# Patient Record
Sex: Male | Born: 1945 | Race: White | Hispanic: No | Marital: Married | Smoking: Former smoker
Health system: Southern US, Community
[De-identification: ages and names within clinical notes are randomized; demographics above are authoritative.]

## PROBLEM LIST (undated history)

## (undated) DIAGNOSIS — I10 Essential (primary) hypertension: Secondary | ICD-10-CM

## (undated) DIAGNOSIS — D126 Benign neoplasm of colon, unspecified: Secondary | ICD-10-CM

## (undated) DIAGNOSIS — K5730 Diverticulosis of large intestine without perforation or abscess without bleeding: Secondary | ICD-10-CM

## (undated) DIAGNOSIS — E291 Testicular hypofunction: Secondary | ICD-10-CM

## (undated) DIAGNOSIS — I4891 Unspecified atrial fibrillation: Principal | ICD-10-CM

## (undated) DIAGNOSIS — G4733 Obstructive sleep apnea (adult) (pediatric): Secondary | ICD-10-CM

## (undated) DIAGNOSIS — K219 Gastro-esophageal reflux disease without esophagitis: Secondary | ICD-10-CM

## (undated) DIAGNOSIS — M109 Gout, unspecified: Secondary | ICD-10-CM

## (undated) DIAGNOSIS — F411 Generalized anxiety disorder: Secondary | ICD-10-CM

## (undated) DIAGNOSIS — I059 Rheumatic mitral valve disease, unspecified: Secondary | ICD-10-CM

## (undated) DIAGNOSIS — I340 Nonrheumatic mitral (valve) insufficiency: Secondary | ICD-10-CM

## (undated) DIAGNOSIS — Z8719 Personal history of other diseases of the digestive system: Secondary | ICD-10-CM

## (undated) DIAGNOSIS — R0989 Other specified symptoms and signs involving the circulatory and respiratory systems: Secondary | ICD-10-CM

## (undated) DIAGNOSIS — E7800 Pure hypercholesterolemia, unspecified: Secondary | ICD-10-CM

## (undated) DIAGNOSIS — G2581 Restless legs syndrome: Secondary | ICD-10-CM

## (undated) DIAGNOSIS — J309 Allergic rhinitis, unspecified: Secondary | ICD-10-CM

## (undated) DIAGNOSIS — J069 Acute upper respiratory infection, unspecified: Secondary | ICD-10-CM

## (undated) DIAGNOSIS — F329 Major depressive disorder, single episode, unspecified: Secondary | ICD-10-CM

## (undated) DIAGNOSIS — R7302 Impaired glucose tolerance (oral): Secondary | ICD-10-CM

## (undated) DIAGNOSIS — R0609 Other forms of dyspnea: Secondary | ICD-10-CM

## (undated) DIAGNOSIS — I38 Endocarditis, valve unspecified: Secondary | ICD-10-CM

## (undated) HISTORY — DX: Unspecified atrial fibrillation: I48.91

## (undated) HISTORY — DX: Generalized anxiety disorder: F41.1

## (undated) HISTORY — DX: Benign neoplasm of colon, unspecified: D12.6

## (undated) HISTORY — DX: Endocarditis, valve unspecified: I38

## (undated) HISTORY — DX: Impaired glucose tolerance (oral): R73.02

## (undated) HISTORY — PX: VASECTOMY: SHX75

## (undated) HISTORY — DX: Obstructive sleep apnea (adult) (pediatric): G47.33

## (undated) HISTORY — DX: Allergic rhinitis, unspecified: J30.9

## (undated) HISTORY — DX: Nonrheumatic mitral (valve) insufficiency: I34.0

## (undated) HISTORY — DX: Gastro-esophageal reflux disease without esophagitis: K21.9

## (undated) HISTORY — DX: Diverticulosis of large intestine without perforation or abscess without bleeding: K57.30

## (undated) HISTORY — DX: Essential (primary) hypertension: I10

## (undated) HISTORY — DX: Major depressive disorder, single episode, unspecified: F32.9

## (undated) HISTORY — DX: Gout, unspecified: M10.9

## (undated) HISTORY — DX: Other forms of dyspnea: R06.09

## (undated) HISTORY — PX: APPENDECTOMY: SHX54

## (undated) HISTORY — DX: Acute upper respiratory infection, unspecified: J06.9

## (undated) HISTORY — PX: SEPTOPLASTY: SHX2393

## (undated) HISTORY — DX: Pure hypercholesterolemia, unspecified: E78.00

## (undated) HISTORY — DX: Restless legs syndrome: G25.81

## (undated) HISTORY — PX: FRACTURE SURGERY: SHX138

## (undated) HISTORY — DX: Personal history of other diseases of the digestive system: Z87.19

## (undated) HISTORY — DX: Other specified symptoms and signs involving the circulatory and respiratory systems: R09.89

## (undated) HISTORY — DX: Rheumatic mitral valve disease, unspecified: I05.9

## (undated) HISTORY — DX: Testicular hypofunction: E29.1

---

## 1984-03-14 DIAGNOSIS — K2990 Gastroduodenitis, unspecified, without bleeding: Secondary | ICD-10-CM

## 1984-03-14 DIAGNOSIS — K2970 Gastritis, unspecified, without bleeding: Secondary | ICD-10-CM | POA: Insufficient documentation

## 1998-09-13 ENCOUNTER — Encounter: Payer: Self-pay | Admitting: Surgery

## 1998-09-13 ENCOUNTER — Emergency Department (HOSPITAL_COMMUNITY): Admission: EM | Admit: 1998-09-13 | Discharge: 1998-09-13 | Payer: Self-pay | Admitting: *Deleted

## 1999-03-16 ENCOUNTER — Ambulatory Visit: Admission: RE | Admit: 1999-03-16 | Discharge: 1999-03-16 | Payer: Self-pay | Admitting: Internal Medicine

## 1999-06-01 ENCOUNTER — Ambulatory Visit: Admission: RE | Admit: 1999-06-01 | Discharge: 1999-06-01 | Payer: Self-pay | Admitting: Family Medicine

## 2001-01-14 DIAGNOSIS — I38 Endocarditis, valve unspecified: Secondary | ICD-10-CM

## 2001-01-14 HISTORY — DX: Endocarditis, valve unspecified: I38

## 2001-01-20 ENCOUNTER — Emergency Department (HOSPITAL_COMMUNITY): Admission: EM | Admit: 2001-01-20 | Discharge: 2001-01-20 | Payer: Self-pay | Admitting: Emergency Medicine

## 2001-01-21 ENCOUNTER — Encounter: Payer: Self-pay | Admitting: Family Medicine

## 2001-01-21 ENCOUNTER — Encounter: Admission: RE | Admit: 2001-01-21 | Discharge: 2001-01-21 | Payer: Self-pay | Admitting: Family Medicine

## 2001-02-07 ENCOUNTER — Inpatient Hospital Stay (HOSPITAL_COMMUNITY): Admission: AD | Admit: 2001-02-07 | Discharge: 2001-02-13 | Payer: Self-pay | Admitting: Internal Medicine

## 2001-02-07 ENCOUNTER — Encounter (INDEPENDENT_AMBULATORY_CARE_PROVIDER_SITE_OTHER): Payer: Self-pay | Admitting: Specialist

## 2001-02-08 ENCOUNTER — Encounter: Payer: Self-pay | Admitting: Internal Medicine

## 2001-02-11 ENCOUNTER — Encounter: Payer: Self-pay | Admitting: Gastroenterology

## 2001-03-04 ENCOUNTER — Encounter: Admission: RE | Admit: 2001-03-04 | Discharge: 2001-03-04 | Payer: Self-pay | Admitting: Infectious Diseases

## 2001-03-14 ENCOUNTER — Ambulatory Visit (HOSPITAL_COMMUNITY): Admission: RE | Admit: 2001-03-14 | Discharge: 2001-03-14 | Payer: Self-pay | Admitting: Infectious Diseases

## 2001-03-14 ENCOUNTER — Encounter: Payer: Self-pay | Admitting: Infectious Diseases

## 2001-03-25 ENCOUNTER — Encounter: Admission: RE | Admit: 2001-03-25 | Discharge: 2001-03-25 | Payer: Self-pay | Admitting: Infectious Diseases

## 2001-04-16 ENCOUNTER — Encounter: Admission: RE | Admit: 2001-04-16 | Discharge: 2001-04-16 | Payer: Self-pay | Admitting: Infectious Diseases

## 2001-04-30 ENCOUNTER — Encounter: Admission: RE | Admit: 2001-04-30 | Discharge: 2001-04-30 | Payer: Self-pay | Admitting: Infectious Diseases

## 2003-10-05 ENCOUNTER — Encounter: Admission: RE | Admit: 2003-10-05 | Discharge: 2003-10-05 | Payer: Self-pay | Admitting: Family Medicine

## 2006-02-01 ENCOUNTER — Ambulatory Visit: Payer: Self-pay | Admitting: Gastroenterology

## 2006-02-07 ENCOUNTER — Ambulatory Visit: Payer: Self-pay | Admitting: Gastroenterology

## 2006-02-07 DIAGNOSIS — D126 Benign neoplasm of colon, unspecified: Secondary | ICD-10-CM

## 2006-02-07 HISTORY — DX: Benign neoplasm of colon, unspecified: D12.6

## 2006-02-08 ENCOUNTER — Inpatient Hospital Stay (HOSPITAL_COMMUNITY): Admission: EM | Admit: 2006-02-08 | Discharge: 2006-02-13 | Payer: Self-pay | Admitting: Emergency Medicine

## 2006-02-09 ENCOUNTER — Ambulatory Visit: Payer: Self-pay | Admitting: Internal Medicine

## 2006-02-11 DIAGNOSIS — K5730 Diverticulosis of large intestine without perforation or abscess without bleeding: Secondary | ICD-10-CM | POA: Insufficient documentation

## 2006-02-11 HISTORY — DX: Diverticulosis of large intestine without perforation or abscess without bleeding: K57.30

## 2006-02-23 ENCOUNTER — Ambulatory Visit: Payer: Self-pay | Admitting: Gastroenterology

## 2006-02-28 ENCOUNTER — Ambulatory Visit: Payer: Self-pay | Admitting: Gastroenterology

## 2006-03-22 ENCOUNTER — Ambulatory Visit: Payer: Self-pay | Admitting: Gastroenterology

## 2007-01-22 ENCOUNTER — Ambulatory Visit: Payer: Self-pay | Admitting: Gastroenterology

## 2007-02-05 ENCOUNTER — Encounter: Payer: Self-pay | Admitting: Gastroenterology

## 2007-02-05 ENCOUNTER — Ambulatory Visit: Payer: Self-pay | Admitting: Gastroenterology

## 2007-04-24 ENCOUNTER — Ambulatory Visit: Payer: Self-pay | Admitting: Pulmonary Disease

## 2007-04-24 LAB — CONVERTED CEMR LAB
Folate: 11.1 ng/mL
Iron: 144 ug/dL (ref 42–165)
Vitamin B-12: 423 pg/mL (ref 211–911)

## 2007-06-11 ENCOUNTER — Ambulatory Visit: Payer: Self-pay | Admitting: Pulmonary Disease

## 2007-06-17 ENCOUNTER — Encounter: Payer: Self-pay | Admitting: Pulmonary Disease

## 2007-06-17 DIAGNOSIS — I10 Essential (primary) hypertension: Secondary | ICD-10-CM

## 2007-06-17 DIAGNOSIS — I059 Rheumatic mitral valve disease, unspecified: Secondary | ICD-10-CM

## 2007-06-17 DIAGNOSIS — F329 Major depressive disorder, single episode, unspecified: Secondary | ICD-10-CM

## 2007-06-17 DIAGNOSIS — F3289 Other specified depressive episodes: Secondary | ICD-10-CM

## 2007-06-17 DIAGNOSIS — F411 Generalized anxiety disorder: Secondary | ICD-10-CM | POA: Insufficient documentation

## 2007-06-17 DIAGNOSIS — M109 Gout, unspecified: Secondary | ICD-10-CM

## 2007-06-17 DIAGNOSIS — E7800 Pure hypercholesterolemia, unspecified: Secondary | ICD-10-CM | POA: Insufficient documentation

## 2007-06-17 DIAGNOSIS — Z8719 Personal history of other diseases of the digestive system: Secondary | ICD-10-CM | POA: Insufficient documentation

## 2007-06-17 DIAGNOSIS — T7840XA Allergy, unspecified, initial encounter: Secondary | ICD-10-CM | POA: Insufficient documentation

## 2007-06-17 DIAGNOSIS — I38 Endocarditis, valve unspecified: Secondary | ICD-10-CM | POA: Insufficient documentation

## 2007-06-17 HISTORY — DX: Rheumatic mitral valve disease, unspecified: I05.9

## 2007-06-17 HISTORY — DX: Pure hypercholesterolemia, unspecified: E78.00

## 2007-06-17 HISTORY — DX: Gout, unspecified: M10.9

## 2007-06-17 HISTORY — DX: Other specified depressive episodes: F32.89

## 2007-06-17 HISTORY — DX: Major depressive disorder, single episode, unspecified: F32.9

## 2007-06-17 HISTORY — DX: Essential (primary) hypertension: I10

## 2007-06-17 HISTORY — DX: Generalized anxiety disorder: F41.1

## 2007-06-26 ENCOUNTER — Ambulatory Visit: Payer: Self-pay | Admitting: Gastroenterology

## 2007-07-01 ENCOUNTER — Encounter: Payer: Self-pay | Admitting: Gastroenterology

## 2007-07-01 ENCOUNTER — Ambulatory Visit (HOSPITAL_COMMUNITY): Admission: RE | Admit: 2007-07-01 | Discharge: 2007-07-01 | Payer: Self-pay | Admitting: Gastroenterology

## 2007-07-12 ENCOUNTER — Encounter: Admission: RE | Admit: 2007-07-12 | Discharge: 2007-07-12 | Payer: Self-pay | Admitting: Orthopedic Surgery

## 2007-07-16 DIAGNOSIS — K219 Gastro-esophageal reflux disease without esophagitis: Secondary | ICD-10-CM

## 2007-07-16 HISTORY — DX: Gastro-esophageal reflux disease without esophagitis: K21.9

## 2007-07-29 ENCOUNTER — Ambulatory Visit: Payer: Self-pay | Admitting: Pulmonary Disease

## 2007-07-29 DIAGNOSIS — G2581 Restless legs syndrome: Secondary | ICD-10-CM

## 2007-07-29 DIAGNOSIS — G4733 Obstructive sleep apnea (adult) (pediatric): Secondary | ICD-10-CM

## 2007-07-29 HISTORY — DX: Obstructive sleep apnea (adult) (pediatric): G47.33

## 2007-07-29 HISTORY — DX: Restless legs syndrome: G25.81

## 2007-12-10 ENCOUNTER — Encounter: Payer: Self-pay | Admitting: Pulmonary Disease

## 2008-01-08 ENCOUNTER — Encounter: Admission: RE | Admit: 2008-01-08 | Discharge: 2008-01-08 | Payer: Self-pay | Admitting: Family Medicine

## 2008-04-17 ENCOUNTER — Telehealth (INDEPENDENT_AMBULATORY_CARE_PROVIDER_SITE_OTHER): Payer: Self-pay | Admitting: *Deleted

## 2008-04-29 ENCOUNTER — Ambulatory Visit: Payer: Self-pay | Admitting: Pulmonary Disease

## 2008-06-16 HISTORY — PX: OTHER SURGICAL HISTORY: SHX169

## 2008-08-27 ENCOUNTER — Encounter: Payer: Self-pay | Admitting: Internal Medicine

## 2008-08-27 ENCOUNTER — Encounter: Admission: RE | Admit: 2008-08-27 | Discharge: 2008-08-27 | Payer: Self-pay | Admitting: Family Medicine

## 2008-10-08 ENCOUNTER — Ambulatory Visit: Payer: Self-pay | Admitting: Internal Medicine

## 2008-10-22 ENCOUNTER — Telehealth: Payer: Self-pay | Admitting: Internal Medicine

## 2008-12-01 ENCOUNTER — Telehealth: Payer: Self-pay | Admitting: Internal Medicine

## 2008-12-07 ENCOUNTER — Telehealth: Payer: Self-pay | Admitting: Internal Medicine

## 2008-12-08 ENCOUNTER — Encounter (INDEPENDENT_AMBULATORY_CARE_PROVIDER_SITE_OTHER): Payer: Self-pay | Admitting: *Deleted

## 2009-01-26 ENCOUNTER — Ambulatory Visit: Payer: Self-pay | Admitting: Gastroenterology

## 2009-02-05 ENCOUNTER — Telehealth: Payer: Self-pay | Admitting: Internal Medicine

## 2009-02-10 ENCOUNTER — Encounter: Payer: Self-pay | Admitting: Gastroenterology

## 2009-02-10 ENCOUNTER — Ambulatory Visit: Payer: Self-pay | Admitting: Gastroenterology

## 2009-02-11 ENCOUNTER — Encounter: Payer: Self-pay | Admitting: Gastroenterology

## 2009-03-01 ENCOUNTER — Telehealth: Payer: Self-pay | Admitting: Internal Medicine

## 2009-03-13 DIAGNOSIS — L255 Unspecified contact dermatitis due to plants, except food: Secondary | ICD-10-CM

## 2009-03-15 ENCOUNTER — Ambulatory Visit: Payer: Self-pay | Admitting: Family Medicine

## 2009-03-29 ENCOUNTER — Ambulatory Visit: Payer: Self-pay | Admitting: Internal Medicine

## 2009-03-29 LAB — CONVERTED CEMR LAB
AST: 38 units/L — ABNORMAL HIGH (ref 0–37)
Alkaline Phosphatase: 74 units/L (ref 39–117)
BUN: 19 mg/dL (ref 6–23)
Basophils Absolute: 0 10*3/uL (ref 0.0–0.1)
Calcium: 8.8 mg/dL (ref 8.4–10.5)
GFR calc non Af Amer: 80.47 mL/min (ref >60)
Glucose, Bld: 120 mg/dL — ABNORMAL HIGH (ref 70–99)
Glucose, Urine, Semiquant: NEGATIVE
HDL: 29.8 mg/dL — ABNORMAL LOW (ref >39.00)
Lymphocytes Relative: 24.1 % (ref 12.0–46.0)
Monocytes Relative: 10.4 % (ref 3.0–12.0)
Platelets: 130 10*3/uL — ABNORMAL LOW (ref 150.0–400.0)
Protein, U semiquant: NEGATIVE
RDW: 12.4 % (ref 11.5–14.6)
Sodium: 139 meq/L (ref 135–145)
Total Bilirubin: 1.6 mg/dL — ABNORMAL HIGH (ref 0.3–1.2)
Urobilinogen, UA: 0.2
VLDL: 33.4 mg/dL (ref 0.0–40.0)
WBC Urine, dipstick: NEGATIVE
pH: 5

## 2009-04-05 ENCOUNTER — Ambulatory Visit: Payer: Self-pay | Admitting: Internal Medicine

## 2009-05-05 ENCOUNTER — Ambulatory Visit: Payer: Self-pay | Admitting: Internal Medicine

## 2009-05-07 ENCOUNTER — Telehealth: Payer: Self-pay | Admitting: Internal Medicine

## 2009-05-17 ENCOUNTER — Telehealth: Payer: Self-pay | Admitting: Internal Medicine

## 2009-05-18 ENCOUNTER — Ambulatory Visit: Payer: Self-pay | Admitting: Internal Medicine

## 2009-05-18 DIAGNOSIS — E291 Testicular hypofunction: Secondary | ICD-10-CM

## 2009-05-18 HISTORY — DX: Testicular hypofunction: E29.1

## 2009-05-18 LAB — CONVERTED CEMR LAB
Sex Hormone Binding: 18 nmol/L (ref 13–71)
Testosterone-% Free: 2.6 % (ref 1.6–2.9)
Testosterone: 197.3 ng/dL — ABNORMAL LOW (ref 350–890)

## 2009-05-24 ENCOUNTER — Encounter: Payer: Self-pay | Admitting: Internal Medicine

## 2009-05-27 ENCOUNTER — Telehealth: Payer: Self-pay | Admitting: Internal Medicine

## 2009-06-03 ENCOUNTER — Telehealth: Payer: Self-pay | Admitting: Internal Medicine

## 2009-06-04 ENCOUNTER — Encounter: Payer: Self-pay | Admitting: Internal Medicine

## 2009-06-08 ENCOUNTER — Ambulatory Visit: Payer: Self-pay | Admitting: Internal Medicine

## 2009-06-08 DIAGNOSIS — J069 Acute upper respiratory infection, unspecified: Secondary | ICD-10-CM | POA: Insufficient documentation

## 2009-06-08 HISTORY — DX: Acute upper respiratory infection, unspecified: J06.9

## 2009-07-13 ENCOUNTER — Encounter: Payer: Self-pay | Admitting: Internal Medicine

## 2009-08-17 ENCOUNTER — Telehealth: Payer: Self-pay | Admitting: Internal Medicine

## 2009-11-23 ENCOUNTER — Telehealth: Payer: Self-pay | Admitting: Internal Medicine

## 2009-11-23 ENCOUNTER — Ambulatory Visit: Payer: Self-pay | Admitting: Internal Medicine

## 2009-11-26 ENCOUNTER — Telehealth: Payer: Self-pay | Admitting: Internal Medicine

## 2009-12-02 ENCOUNTER — Ambulatory Visit: Payer: Self-pay | Admitting: Internal Medicine

## 2009-12-02 DIAGNOSIS — J309 Allergic rhinitis, unspecified: Secondary | ICD-10-CM | POA: Insufficient documentation

## 2009-12-02 HISTORY — DX: Allergic rhinitis, unspecified: J30.9

## 2009-12-15 HISTORY — PX: US ECHOCARDIOGRAPHY: HXRAD669

## 2009-12-16 ENCOUNTER — Telehealth: Payer: Self-pay | Admitting: Internal Medicine

## 2009-12-20 ENCOUNTER — Telehealth (INDEPENDENT_AMBULATORY_CARE_PROVIDER_SITE_OTHER): Payer: Self-pay | Admitting: *Deleted

## 2009-12-20 ENCOUNTER — Ambulatory Visit: Payer: Self-pay | Admitting: Internal Medicine

## 2009-12-20 DIAGNOSIS — R0609 Other forms of dyspnea: Secondary | ICD-10-CM

## 2009-12-20 DIAGNOSIS — R042 Hemoptysis: Secondary | ICD-10-CM | POA: Insufficient documentation

## 2009-12-20 DIAGNOSIS — R05 Cough: Secondary | ICD-10-CM

## 2009-12-20 DIAGNOSIS — R0989 Other specified symptoms and signs involving the circulatory and respiratory systems: Secondary | ICD-10-CM

## 2009-12-20 DIAGNOSIS — R059 Cough, unspecified: Secondary | ICD-10-CM | POA: Insufficient documentation

## 2009-12-20 HISTORY — DX: Other forms of dyspnea: R06.09

## 2009-12-25 LAB — CONVERTED CEMR LAB: IgE (Immunoglobulin E), Serum: 15.9 intl units/mL (ref 0.0–180.0)

## 2009-12-27 ENCOUNTER — Telehealth: Payer: Self-pay | Admitting: Internal Medicine

## 2009-12-27 ENCOUNTER — Ambulatory Visit: Payer: Self-pay | Admitting: Internal Medicine

## 2009-12-27 LAB — CONVERTED CEMR LAB
Basophils Absolute: 0.1 10*3/uL (ref 0.0–0.1)
Eosinophils Absolute: 0.1 10*3/uL (ref 0.0–0.7)
HCT: 37.8 % — ABNORMAL LOW (ref 39.0–52.0)
Lymphocytes Relative: 17.6 % (ref 12.0–46.0)
Lymphs Abs: 1.2 10*3/uL (ref 0.7–4.0)
Monocytes Relative: 8.1 % (ref 3.0–12.0)
Platelets: 131 10*3/uL — ABNORMAL LOW (ref 150.0–400.0)
RDW: 14.1 % (ref 11.5–14.6)

## 2009-12-28 ENCOUNTER — Encounter: Payer: Self-pay | Admitting: Internal Medicine

## 2009-12-30 ENCOUNTER — Ambulatory Visit: Payer: Self-pay | Admitting: Internal Medicine

## 2009-12-30 DIAGNOSIS — J189 Pneumonia, unspecified organism: Secondary | ICD-10-CM

## 2009-12-30 DIAGNOSIS — I4891 Unspecified atrial fibrillation: Secondary | ICD-10-CM

## 2009-12-30 HISTORY — DX: Unspecified atrial fibrillation: I48.91

## 2010-01-04 ENCOUNTER — Ambulatory Visit: Payer: Self-pay | Admitting: Internal Medicine

## 2010-01-07 ENCOUNTER — Ambulatory Visit: Payer: Self-pay | Admitting: Internal Medicine

## 2010-01-07 ENCOUNTER — Encounter: Payer: Self-pay | Admitting: Internal Medicine

## 2010-01-07 ENCOUNTER — Telehealth (INDEPENDENT_AMBULATORY_CARE_PROVIDER_SITE_OTHER): Payer: Self-pay | Admitting: *Deleted

## 2010-01-07 ENCOUNTER — Ambulatory Visit: Payer: Self-pay | Admitting: Cardiology

## 2010-01-10 ENCOUNTER — Ambulatory Visit: Payer: Self-pay | Admitting: Internal Medicine

## 2010-01-10 ENCOUNTER — Telehealth: Payer: Self-pay | Admitting: Internal Medicine

## 2010-01-10 LAB — CONVERTED CEMR LAB
Eosinophils Relative: 1 % (ref 0–5)
HCT: 37.9 % — ABNORMAL LOW (ref 39.0–52.0)
Lymphocytes Relative: 15 % (ref 12–46)
Lymphs Abs: 1.3 10*3/uL (ref 0.7–4.0)
Neutro Abs: 6.3 10*3/uL (ref 1.7–7.7)
Platelets: 154 10*3/uL (ref 150–400)
WBC: 8.4 10*3/uL (ref 4.0–10.5)

## 2010-01-12 ENCOUNTER — Encounter: Payer: Self-pay | Admitting: Internal Medicine

## 2010-01-13 ENCOUNTER — Telehealth: Payer: Self-pay | Admitting: Internal Medicine

## 2010-01-14 ENCOUNTER — Telehealth (INDEPENDENT_AMBULATORY_CARE_PROVIDER_SITE_OTHER): Payer: Self-pay | Admitting: *Deleted

## 2010-01-14 HISTORY — PX: CARDIOVERSION: SHX1299

## 2010-01-19 ENCOUNTER — Telehealth: Payer: Self-pay | Admitting: Internal Medicine

## 2010-01-25 ENCOUNTER — Ambulatory Visit: Payer: Self-pay | Admitting: Internal Medicine

## 2010-02-07 ENCOUNTER — Ambulatory Visit: Payer: Self-pay | Admitting: Internal Medicine

## 2010-02-09 ENCOUNTER — Encounter: Payer: Self-pay | Admitting: Internal Medicine

## 2010-02-17 ENCOUNTER — Ambulatory Visit: Payer: Self-pay | Admitting: Cardiology

## 2010-02-17 ENCOUNTER — Ambulatory Visit (HOSPITAL_COMMUNITY): Admission: RE | Admit: 2010-02-17 | Discharge: 2010-02-17 | Payer: Self-pay | Admitting: Cardiology

## 2010-02-24 ENCOUNTER — Ambulatory Visit: Payer: Self-pay | Admitting: Cardiology

## 2010-03-22 ENCOUNTER — Ambulatory Visit: Payer: Self-pay | Admitting: Cardiology

## 2010-04-05 ENCOUNTER — Ambulatory Visit: Payer: Self-pay | Admitting: Cardiology

## 2010-04-06 ENCOUNTER — Telehealth: Payer: Self-pay | Admitting: Internal Medicine

## 2010-04-21 ENCOUNTER — Ambulatory Visit: Payer: Self-pay | Admitting: Internal Medicine

## 2010-04-21 LAB — CONVERTED CEMR LAB
ALT: 35 units/L (ref 0–53)
BUN: 20 mg/dL (ref 6–23)
Basophils Relative: 0.5 % (ref 0.0–3.0)
Bilirubin, Direct: 0.3 mg/dL (ref 0.0–0.3)
CO2: 30 meq/L (ref 19–32)
Chloride: 102 meq/L (ref 96–112)
Cholesterol: 135 mg/dL (ref 0–200)
Creatinine, Ser: 1 mg/dL (ref 0.4–1.5)
Eosinophils Absolute: 0.2 10*3/uL (ref 0.0–0.7)
Eosinophils Relative: 4.3 % (ref 0.0–5.0)
Glucose, Urine, Semiquant: NEGATIVE
HCT: 41.7 % (ref 39.0–52.0)
Ketones, urine, test strip: NEGATIVE
Lymphs Abs: 1.6 10*3/uL (ref 0.7–4.0)
MCHC: 34.7 g/dL (ref 30.0–36.0)
MCV: 97.3 fL (ref 78.0–100.0)
Monocytes Absolute: 0.5 10*3/uL (ref 0.1–1.0)
Neutrophils Relative %: 56.3 % (ref 43.0–77.0)
PSA: 0.71 ng/mL (ref 0.10–4.00)
Platelets: 131 10*3/uL — ABNORMAL LOW (ref 150.0–400.0)
Potassium: 4.3 meq/L (ref 3.5–5.1)
Specific Gravity, Urine: 1.025
TSH: 1.88 microintl units/mL (ref 0.35–5.50)
Total Bilirubin: 2.2 mg/dL — ABNORMAL HIGH (ref 0.3–1.2)
Total Protein: 6.6 g/dL (ref 6.0–8.3)
Triglycerides: 197 mg/dL — ABNORMAL HIGH (ref 0.0–149.0)
Urobilinogen, UA: 1
pH: 5

## 2010-04-28 ENCOUNTER — Ambulatory Visit: Payer: Self-pay | Admitting: Internal Medicine

## 2010-05-02 ENCOUNTER — Ambulatory Visit: Payer: Self-pay | Admitting: Cardiology

## 2010-05-04 ENCOUNTER — Telehealth: Payer: Self-pay

## 2010-06-03 ENCOUNTER — Ambulatory Visit: Payer: Self-pay | Admitting: Cardiology

## 2010-06-28 ENCOUNTER — Ambulatory Visit: Payer: Self-pay | Admitting: Cardiology

## 2010-07-05 ENCOUNTER — Ambulatory Visit: Payer: Self-pay | Admitting: Cardiology

## 2010-07-20 ENCOUNTER — Ambulatory Visit: Payer: Self-pay | Admitting: Cardiology

## 2010-07-26 ENCOUNTER — Telehealth: Payer: Self-pay | Admitting: Internal Medicine

## 2010-07-26 ENCOUNTER — Ambulatory Visit
Admission: RE | Admit: 2010-07-26 | Discharge: 2010-07-26 | Payer: Self-pay | Source: Home / Self Care | Attending: Internal Medicine | Admitting: Internal Medicine

## 2010-08-03 ENCOUNTER — Telehealth (INDEPENDENT_AMBULATORY_CARE_PROVIDER_SITE_OTHER): Payer: Self-pay | Admitting: *Deleted

## 2010-08-05 ENCOUNTER — Ambulatory Visit: Payer: Self-pay | Admitting: Cardiology

## 2010-08-15 ENCOUNTER — Emergency Department (HOSPITAL_COMMUNITY)
Admission: EM | Admit: 2010-08-15 | Discharge: 2010-08-15 | Payer: Self-pay | Source: Home / Self Care | Admitting: Emergency Medicine

## 2010-08-15 LAB — BASIC METABOLIC PANEL
BUN: 11 mg/dL (ref 6–23)
CO2: 27 mEq/L (ref 19–32)
Calcium: 8.2 mg/dL — ABNORMAL LOW (ref 8.4–10.5)
Chloride: 103 mEq/L (ref 96–112)
Creatinine, Ser: 0.94 mg/dL (ref 0.4–1.5)
GFR calc Af Amer: >60 mL/min (ref >60)

## 2010-08-15 LAB — PROTIME-INR: Prothrombin Time: 25.4 seconds — ABNORMAL HIGH (ref 11.6–15.2)

## 2010-08-18 ENCOUNTER — Ambulatory Visit (INDEPENDENT_AMBULATORY_CARE_PROVIDER_SITE_OTHER): Payer: No Typology Code available for payment source | Admitting: Cardiology

## 2010-08-18 DIAGNOSIS — I4891 Unspecified atrial fibrillation: Secondary | ICD-10-CM

## 2010-08-18 NOTE — Progress Notes (Signed)
Summary: MED SWITCH  Phone Note Call from Patient Call back at Home Phone [phone]   Caller: Patient Call For: Dennis Savers  MD Summary of Call: PT WOULD LIKE TO SWITCH TO LEXAPRO AND TO LIPTOR INSTEAD OF CRESTOR. [facility]  [phone] Initial call taken by: Heron Sabins,  July 26, 2010 11:02 AM  Follow-up for Phone Call        ok TO SWITCH TO GENERIC LIPITOR 20 MG AND GENERIC LEXAPRO 20 MG  Follow-up by: Dennis Savers  MD,  July 26, 2010 12:24 PM    New/Updated Medications: LIPITOR 20 MG TABS (ATORVASTATIN CALCIUM) once daily LEXAPRO 20 MG TABS (ESCITALOPRAM OXALATE) qd Prescriptions: LEXAPRO 20 MG TABS (ESCITALOPRAM OXALATE) qd  #30 x 6   Entered by:   Duard Brady LPN   Authorized by:   Dennis Savers  MD   Signed by:   Duard Brady LPN on 82/99/3716   Method used:   Electronically to        [facility]. [phone]* (retail)       [phone]       [place], [place]  [address]       Ph: [phone]       Fax: [phone]   RxID:   [id number] LIPITOR 20 MG TABS (ATORVASTATIN CALCIUM) once daily  #30 x 6   Entered by:   Duard Brady LPN   Authorized by:   Dennis Savers  MD   Signed by:   Duard Brady LPN on 71/24/5809   Method used:   Electronically to        [facility]. [phone]* (retail)       [phone]       [place], [place]  [address]       Ph: [phone]       Fax: [phone]   RxID:   [id number]

## 2010-08-18 NOTE — Progress Notes (Signed)
Summary: cpap machine  Phone Note Call from Patient   Caller: Patient Call For: Avnoor Koury Reason for Call: Talk to Nurse Summary of Call: pt on cpap, was put on new machine to check pressure.  Done with testing, DME has told pt it is time for a new machine.  Pt really liked new machine he was tested on.  Just need script for new machine, has humidifier and heater and adjust pressure automatically.  New machine is quiet. Initial call taken by: Eugene Gavia,  January 10, 2010 3:49 PM  Follow-up for Phone Call        I met him when he came in for PFT and discussed his abnormal CXR/ hemoptysis, as well as he preference for autoPAP. I gave him a script for autoPAP. Follow-up by: Waymon Budge MD,  January 10, 2010 4:03 PM    New/Updated Medications: * CPAP MACHINE AUTOPAP 5-15, HUMIDIFIER Intolerant of fixed cpap Prescriptions: CPAP MACHINE AUTOPAP 5-15, HUMIDIFIER Intolerant of fixed cpap  #1 x prn   Entered and Authorized by:   Waymon Budge MD   Signed by:   Waymon Budge MD on 01/10/2010   Method used:   Print then Give to Patient   RxID:   [id number]

## 2010-08-18 NOTE — Assessment & Plan Note (Signed)
Summary: allergy testing/kcw   Vital Signs:  Patient profile:   65 year old male Height:      75 inches Weight:      254 pounds BMI:     31.86 O2 Sat:      97 % on Room air Pulse rate:   100 / minute BP sitting:   122 / 84  (right arm) Cuff size:   regular  Vitals Entered By: Vivianne Spence  O2 Flow:  Room air CC: Allergy Testing   Copy to:  Dr Amador Cunas Primary Provider/Referring Provider:  [name]  MD  CC:  Allergy Testing.  History of Present Illness: December 20, 2009- 63 yom referred for persistent cough with new onset hemoptysis. He only admitted as we were finishing, that he had been coughng streaks of red blood in mucus over 3 days, with none in last 3 days. He has coughed for several months. He said this had been a bad year for allergies, initially implying he felt his was an allergy problem. Throat tickle cough with no wheeze, chest pain, nodes, fever. More aware of exertional dyspnea on stairs and hills and weight up 25 lbs in past year. Allergic rhinitis since age 78. Allergy vaccine remotely, directed at common inhalants. Perennial cough and postnasal drip, worst in Spring and Fall. No hx of asthma. Recently changed Benicar to Benazepril and Zyrtec to Allegra-D 24 hr for his insurance HSA.  Says Zyrtec and Allegra-D not good enough. CXR 08/26/09- shallow inspiration, NAD. Hx OSA/ CPAP- Dr Craige Cotta- used all night every night. Hx LAUP. Hx GERD- controlled. 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 hx treadmill 2010 Dr Deborah Chalk "OK".  January 04, 2010- Hemoptysis/ pneumonia, ? allergy, cough, AF/ warfarin, OSA Newly dx'd AF- warfarin by Dr Deborah Chalk. CBC w/o leukocytosis. No further hemoptysis despite coumadin. Feels weaker and easier dyspneic with exertion. He asks if this is related to his OSA. We also discussed his heart status. He has stopped the decongestant and is taking the plain allegra only. We agreed to wait on allergy testing.  Coughing occ trace brown. Dr Kirtland Bouchard gave Zpak- completed. CXR reviewed fronm 2 weeks ago- ? RLL pneumonia or possibly parenchymal blood. 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 IgE 15.9, no specific elevations on panmel.     Preventive Screening-Counseling & Management  Alcohol-Tobacco     Smoking Status: quit     Year Quit: 1978  Current Medications (verified): 1)  Xanax 1 Mg  Tabs (Alprazolam) .... Once Daily 2)  Zyrtec Allergy 10 Mg  Tabs (Cetirizine Hcl) .... As Needed 3)  Adult Aspirin Low Strength 81 Mg  Tbdp (Aspirin) .... Once Daily 4)  Allopurinol 300 Mg  Tabs (Allopurinol) .... Once Daily 5)  Omeprazole 40 Mg  Cpdr (Omeprazole) .Marland Kitchen.. 1 By Mouth Daily 6)  Niaspan 500 Mg Cr-Tabs (Niacin (Antihyperlipidemic)) .... 2  By Mouth Daily 7)  Hydrocodone-Homatropine 5-1.5 Mg/11ml Syrp (Hydrocodone-Homatropine) .Marland Kitchen.. 1 Teaspoon Every 6 Hours As Needed For Cough 8)  Fish Oil 1000 Mg Caps (Omega-3 Fatty Acids) .... Bid 9)  Vitamin C 500 Mg Tabs (Ascorbic Acid) .... Qd 10)  Citalopram Hydrobromide 40 Mg Tabs (Citalopram Hydrobromide) .... One Daily 11)  Fexofenadine Hcl 180 Mg Tabs (Fexofenadine Hcl) .... One Daily 12)  Simvastatin 40 Mg Tabs (Simvastatin) .... One Daily 13)  Benazepril Hcl 40 Mg Tabs (Benazepril Hcl) .... One Daily 14)  Flonase 50 Mcg/act  Susp (Fluticasone Propionate) .Marland Kitchen.. 1-2 Sprays Each Nostril Once Daily At Bedtime. 15)  Warfarin Sodium 5 Mg Tabs (Warfarin Sodium) .... One Daily 16)  Diltiazem Hcl Cr 120 Mg Xr12h-Cap (Diltiazem Hcl) .... Two Times A Day  Allergies (verified): No Known Drug Allergies  Past History:  Past Medical History: Last updated: 12/30/2009 GERD (ICD-530.81) RESTLESS LEG SYNDROME (ICD-333.94) DIVERTICULOSIS, COLON (ICD-562.10) GASTRITIS (ICD-535.50) TUBULOVILLOUS ADENOMA, COLON (ICD-211.3) GASTROINTESTINAL HEMORRHAGE, HX OF (ICD-V12.79)-duodenal ulcer with hemorrhage in 1983 ENDOCARDITIS  (ICD-424.90) 2002 MITRAL VALVE PROLAPSE (ICD-424.0) GOUT (ICD-274.9) ANXIETY (ICD-300.00) DEPRESSION (ICD-311) SLEEP APNEA (ICD-780.57) lumbar disk disease ([place]) ALLERGY (ICD-995.3) ALLERGIC Rhinitis HYPERCHOLESTEROLEMIA (ICD-272.0) HYPERTENSION (ICD-401.9) Atrial fibrillation-onset December 30, 2009  Past Surgical History: Last updated: 12-Jan-2010 Appendectomy Septoplasty 1982 Uvulopharyngoplasty Vasectomy 1976  colonoscopy 2007,2008, 2010  Stress test in December 2009  Family History: Last updated: January 12, 2010 Father- died CVA age 80, Mouth ca, COPD Mother-   h/o breast Ca, heart dis  one brother one sister  Social History: Last updated: 01-12-10 Married Smoked 1 PPD x 20 yrs, quit 1980's Regular exercise-no Business owner- renovates churches, administrative, much travel ETOH- 2-4 drinks almost daily  Risk Factors: Exercise: no (10/08/2008)  Risk Factors: Smoking Status: quit (01/04/2010)  Social History: Smoking Status:  quit  Physical Exam  Additional Exam:  General: A/Ox3; pleasant and cooperative, NAD, SKIN: no rash, lesions NODES: no lymphadenopathy HEENT: [place]/AT, EOM- WNL, Conjuctivae- injected, PERRLA, TM-WNL, Nose-mucus bridging, Throat- clear and wnl, Mallampati  III NECK: Supple w/ fair ROM, JVD- none, normal carotid impulses w/o bruits Thyroid- normal to palpation CHEST: fine rhonchi right lateral chest HEART: RRR, no m/g/r heard ABDOMEN: Soft and nl;  ZOX:WRUE, nl pulses, no edema  NEURO: Grossly intact to observation      Impression & Recommendations:  Problem # 1:  HEMOPTYSIS UNSPECIFIED (ICD-786.30) This was related to new infiltrate RLL and may have been a hemorrhagic bronchitis or pulmonary hemorrhage. Fortunately he hasn't re-bled on coumadin. The following medications were removed from the medication list:    Zithromax Z-pak 250 Mg Tabs (Azithromycin) .Marland Kitchen..Marland Kitchen Two initially, then one daily for 4 additional days His updated  medication list for this problem includes:    Vitamin C 500 Mg Tabs (Ascorbic acid) ..... Qd  Problem # 2:  COUGH (ICD-786.2)  We are deferring allergy work up to reconsider in the future if needed  Problem # 3:  PNEUMONIA, RIGHT LOWER LOBE (ICD-486)  He has comppleted Z pak. Sputum sounds a little purulent or perhaps old blood. We will repeat CXR. The following medications were removed from the medication list:    Zithromax Z-pak 250 Mg Tabs (Azithromycin) .Marland Kitchen..Marland Kitchen Two initially, then one daily for 4 additional days  Problem # 4:  OBSTRUCTIVE SLEEP APNEA (ICD-327.23) He has felt tireder, noting some drowsiness in church even before the Afib. I discussed since he was here today and we will autotitrate for pressure recheck.   Medications Added to Medication List This Visit: 1)  Diltiazem Hcl Cr 120 Mg Xr12h-cap (Diltiazem hcl) .... Two times a day 2)  Cpap [facility]   Other Orders: Est. Patient Level III (45409) DME Referral (DME) T-2 View CXR (71020TC)  Patient Instructions: 1)  Please schedule a follow-up appointment in 3 weeks. 2)  A chest x-ray has been recommended.  Your imaging study may require preauthorization.  3)  PCC wil contact [facility] about autotitrating your CPAP for presure check

## 2010-08-18 NOTE — Progress Notes (Signed)
Summary: new rx coumadin directions  Phone Note Refill Request Message from:  Fax from Pharmacy on January 13, 2010 9:01 AM  Refills Requested: Medication #1:  WARFARIN SODIUM 5 MG TABS one daily need change in sig for insurance to cover . 1 to 1 1/2 by mouth once daily [facility]   Method Requested: Fax to Local Pharmacy Initial call taken by: Duard Brady LPN,  January 13, 2010 9:02 AM  Follow-up for Phone Call        change made to sig - new rx [facility]. KIK Follow-up by: Duard Brady LPN,  January 13, 2010 9:04 AM    New/Updated Medications: WARFARIN SODIUM 5 MG TABS (WARFARIN SODIUM) 1 to 1-1/2 tabs by mouth once daily Prescriptions: WARFARIN SODIUM 5 MG TABS (WARFARIN SODIUM) 1 to 1-1/2 tabs by mouth once daily  #45 x 6   Entered by:   Duard Brady LPN   Authorized by:   [name]  MD   Signed by:   Duard Brady LPN on 24/40/1027   Method used:   Electronically to        [facility]. [phone]* (retail)       [phone]       [place], [place]  [address]       Ph: [phone]       Fax: [phone]   RxID:   [id number]

## 2010-08-18 NOTE — Progress Notes (Signed)
Summary: appeal next week  Phone Note Other Incoming   Caller: andrea with [phone] Summary of Call: Appeal meeting next week.  Do have testerone from 05-05-09 & from 05-18-09 & free testerone from 05-18-09.  If any other info you'd like to submit, our fax number is [phone] Georjean Mode.  If questions call me at [phone].   Initial call taken by: Rudy Jew, RN,  August 17, 2009 9:58 AM  Follow-up for Phone Call        noted Follow-up by: [name]  MD,  August 17, 2009 12:44 PM

## 2010-08-18 NOTE — Assessment & Plan Note (Signed)
Summary: discuss labs/dm   Vital Signs:  Patient profile:   65 year old male Weight:      255 pounds Temp:     98.2 degrees F oral BP sitting:   128 / 80  (left arm) Cuff size:   large  Vitals Entered By: Duard Brady LPN (December 30, 2009 11:11 AM) CC: F/U on labs Is Patient Diabetic? No   Primary Care Provider:  [name]  MD  CC:  F/U on labs.  History of Present Illness: 65 year old patient  who has a 4 week history of cough   which occasionally has been blood-tinged.  He had a chest x-ray performed a few weeks ago.  That was consistent the right lower lobe pneumonia.  He has had some earlier, fever and chills, and due to a history of endocarditis.  Blood cultures were obtained.  These have been normal to date.  At the present time.  He still has some cough, but no further hemoptysis and in general, is better.  He is scheduled for a 2-D echocardiogram next week.  He does have a history of mitral valve prolapse  Preventive Screening-Counseling & Management  Alcohol-Tobacco     Smoking Status: never  Allergies (verified): No Known Drug Allergies  Past History:  Past Medical History: GERD (ICD-530.81) RESTLESS LEG SYNDROME (ICD-333.94) DIVERTICULOSIS, COLON (ICD-562.10) GASTRITIS (ICD-535.50) TUBULOVILLOUS ADENOMA, COLON (ICD-211.3) GASTROINTESTINAL HEMORRHAGE, HX OF (ICD-V12.79)-duodenal ulcer with hemorrhage in 1983 ENDOCARDITIS (ICD-424.90) 2002 MITRAL VALVE PROLAPSE (ICD-424.0) GOUT (ICD-274.9) ANXIETY (ICD-300.00) DEPRESSION (ICD-311) SLEEP APNEA (ICD-780.57) lumbar disk disease ([place]) ALLERGY (ICD-995.3) ALLERGIC Rhinitis HYPERCHOLESTEROLEMIA (ICD-272.0) HYPERTENSION (ICD-401.9) Atrial fibrillation-onset December 30, 2009  Social History: Smoking Status:  never  Review of Systems       The patient complains of fever and prolonged cough.  The patient denies anorexia, weight loss, weight gain, vision loss, decreased hearing, hoarseness, chest  pain, syncope, dyspnea on exertion, peripheral edema, headaches, hemoptysis, abdominal pain, melena, hematochezia, severe indigestion/heartburn, hematuria, incontinence, genital sores, muscle weakness, suspicious skin lesions, transient blindness, difficulty walking, depression, unusual weight change, abnormal bleeding, enlarged lymph nodes, angioedema, breast masses, and testicular masses.    Physical Exam  General:  overweight-appearing.  normal blood pressure, afebrileoverweight-appearing.   Head:  Normocephalic and atraumatic without obvious abnormalities. No apparent alopecia or balding. Eyes:  No corneal or conjunctival inflammation noted. EOMI. Perrla. Funduscopic exam benign, without hemorrhages, exudates or papilledema. Vision grossly normal. Ears:  External ear exam shows no significant lesions or deformities.  Otoscopic examination reveals clear canals, tympanic membranes are intact bilaterally without bulging, retraction, inflammation or discharge. Hearing is grossly normal bilaterally. Mouth:  Oral mucosa and oropharynx without lesions or exudates.  Teeth in good repair. Neck:  No deformities, masses, or tenderness noted. Lungs:  rales present at the right base Heart:  rhythm is  irregular grade 3/6 systolic murmur, loudest at the apex Abdomen:  Bowel sounds positive,abdomen soft and non-tender without masses, organomegaly or hernias noted.   Impression & Recommendations:  Problem # 1:  PNEUMONIA, RIGHT LOWER LOBE (ICD-486)  His updated medication list for this problem includes:    Zithromax Z-pak 250 Mg Tabs (Azithromycin) .Marland Kitchen..Marland Kitchen Two initially, then one daily for 4 additional days  His updated medication list for this problem includes:    Zithromax Z-pak 250 Mg Tabs (Azithromycin) .Marland Kitchen..Marland Kitchen Two initially, then one daily for 4 additional days  Problem # 2:  DYSPNEA ON EXERTION (ICD-786.09)  His updated medication list for this problem includes:  Benazepril Hcl 40 Mg Tabs  (Benazepril hcl) ..... One daily  His updated medication list for this problem includes:    Benazepril Hcl 40 Mg Tabs (Benazepril hcl) ..... One daily  Problem # 3:  ATRIAL FIBRILLATION (ICD-427.31)  His updated medication list for this problem includes:    Adult Aspirin Low Strength 81 Mg Tbdp (Aspirin) ..... Once daily    Warfarin Sodium 5 Mg Tabs (Warfarin sodium) ..... One daily    Diltiazem Hcl Cr 120 Mg Xr12h-cap (Diltiazem hcl) ..... One daily  Complete Medication List: 1)  Xanax 1 Mg Tabs (Alprazolam) .... Once daily 2)  Zyrtec Allergy 10 Mg Tabs (Cetirizine hcl) .... As needed 3)  Adult Aspirin Low Strength 81 Mg Tbdp (Aspirin) .... Once daily 4)  Allopurinol 300 Mg Tabs (Allopurinol) .... Once daily 5)  Colchicine 0.6 Mg Tabs (Colchicine) .... One tablet four times daily until gout resolves. 6)  Omeprazole 40 Mg Cpdr (Omeprazole) .Marland Kitchen.. 1 by mouth daily 7)  Niaspan 500 Mg Cr-tabs (Niacin (antihyperlipidemic)) .... 2  by mouth daily 8)  Hydrocodone-homatropine 5-1.5 Mg/35ml Syrp (Hydrocodone-homatropine) .Marland Kitchen.. 1 teaspoon every 6 hours as needed for cough 9)  Fish Oil 1000 Mg Caps (Omega-3 fatty acids) .... Bid 10)  Vitamin C 500 Mg Tabs (Ascorbic acid) .... Qd 11)  Citalopram Hydrobromide 40 Mg Tabs (Citalopram hydrobromide) .... One daily 12)  Fexofenadine Hcl 180 Mg Tabs (Fexofenadine hcl) .... One daily 13)  Simvastatin 40 Mg Tabs (Simvastatin) .... One daily 14)  Benazepril Hcl 40 Mg Tabs (Benazepril hcl) .... One daily 15)  Tessalon 200 Mg Caps (Benzonatate) .Marland Kitchen.. 1 by mouth three times a day prn 16)  Flonase 50 Mcg/act Susp (Fluticasone propionate) .Marland Kitchen.. 1-2 sprays each nostril once daily at bedtime. 17)  Zithromax Z-pak 250 Mg Tabs (Azithromycin) .... Two initially, then one daily for 4 additional days 18)  Warfarin Sodium 5 Mg Tabs (Warfarin sodium) .... One daily 19)  Diltiazem Hcl Cr 120 Mg Xr12h-cap (Diltiazem hcl) .... One daily  Other Orders: EKG w/ Interpretation  (93000)  Patient Instructions: 1)  follow-up with cardiology in 4 days. 2)  Take your antibiotic as prescribed until ALL of it is gone, but stop if you develop a rash or swelling and contact our office as soon as possible. Prescriptions: DILTIAZEM HCL CR 120 MG XR12H-CAP (DILTIAZEM HCL) one daily  #30 x 0   Entered and Authorized by:   [name]  MD   Signed by:   [name]  MD on 12/30/2009   Method used:   Electronically to        [facility]. [phone]* (retail)       [phone]       [place], [place]  [address]       Ph: [phone]       Fax: [phone]   RxID:   [id number] WARFARIN SODIUM 5 MG TABS (WARFARIN SODIUM) one daily  #30 x 0   Entered and Authorized by:   [name]  MD   Signed by:   [name]  MD on 12/30/2009   Method used:   Electronically to        [facility]. [phone]* (retail)       [phone]       [place], [place]  [address]       Ph:  [phone]       Fax: [phone]   RxID:   [id number] Z-PAK 250 MG TABS (AZITHROMYCIN) two initially, then one daily for 4 additional days  #6 x 0   Entered and Authorized by:   [name]  MD   Signed by:   [name]  MD on 12/30/2009   Method used:   Electronically to        [facility]. [phone]* (retail)       [phone] [address]       [place], [place]  [address]       Ph: [phone]       Fax: [phone]   RxID:   [id number]

## 2010-08-18 NOTE — Assessment & Plan Note (Signed)
Summary: 3 week return/mhh   Copy to:  Dr Amador Cunas Primary Provider/Referring Provider:  [name]  MD  CC:  3 week follow up  and review PFTs.  History of Present Illness:  January 04, 2010- Hemoptysis/ pneumonia, ? allergy, cough, AF/ warfarin, OSA Newly dx'd AF- warfarin by Dr Deborah Chalk. CBC w/o leukocytosis. No further hemoptysis despite coumadin. Feels weaker and easier dyspneic with exertion. He asks if this is related to his OSA. We also discussed his heart status. He has stopped the decongestant and is taking the plain allegra only. We agreed to wait on allergy testing. Coughing occ trace brown. Dr Kirtland Bouchard gave Zpak- completed. CXR reviewed fronm 2 weeks ago- ? RLL pneumonia or possibly parenchymal blood. 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 IgE 15.9, no specific elevations on panmel.  January 25, 2010- Hemoptysis/ pneumonia, ? allergy, Cough, AF/ warfarin, OSA Feels well now, with no cough. He feels back to normal. He remains in AF and is going back to Dr Deborah Chalk tomorrow. There has been no more hemoptysis. His original question to me about possible allergy is on hold unless it bothers him. He likes CPAP auto at 5-15 and is compliant with this. We reviewed PFT showing mild obstruction only in small airways.  CT- minor residual effusion, but no active pneumonia. He is almost completely cleared.   Preventive Screening-Counseling & Management  Alcohol-Tobacco     Smoking Status: quit > 6 months     Packs/Day: 1.0     Year Quit: 1980     Pack years: 20  Current Medications (verified): 1)  Xanax 1 Mg  Tabs (Alprazolam) .... Once Daily 2)  Adult Aspirin Low Strength 81 Mg  Tbdp (Aspirin) .... Once Daily 3)  Allopurinol 300 Mg  Tabs (Allopurinol) .... Once Daily 4)  Omeprazole 40 Mg  Cpdr (Omeprazole) .Marland Kitchen.. 1 By Mouth Daily 5)  Niaspan 500 Mg Cr-Tabs (Niacin (Antihyperlipidemic)) .... 2  By Mouth Daily 6)  Fish Oil 1000 Mg  Caps (Omega-3 Fatty Acids) .... Bid 7)  Vitamin C 500 Mg Tabs (Ascorbic Acid) .... Qd 8)  Citalopram Hydrobromide 40 Mg Tabs (Citalopram Hydrobromide) .... One Daily 9)  Fexofenadine Hcl 180 Mg Tabs (Fexofenadine Hcl) .... One Daily 10)  Flonase 50 Mcg/act Susp (Fluticasone Propionate) .Marland Kitchen.. 1-2 Sprays Each Nostril Once Daily At Bedtime. 11)  Warfarin Sodium 5 Mg Tabs (Warfarin Sodium) .Marland Kitchen.. 1 To 1-1/2 Tabs By Mouth Once Daily 12)  Cpap Machine Autopap 5-15, Humidifier .... Intolerant of Fixed Cpap 13)  Cpap Autopap 5-15 .... May Set Either Auto or Fixed Pressure 11, If Machine Can't Auto. 14)  Cardizem Cd 240 Mg Xr24h-Cap (Diltiazem Hcl Coated Beads) .Marland Kitchen.. 1 Once Daily  Allergies (verified): No Known Drug Allergies  Past History:  Past Medical History: Last updated: 12/30/2009 GERD (ICD-530.81) RESTLESS LEG SYNDROME (ICD-333.94) DIVERTICULOSIS, COLON (ICD-562.10) GASTRITIS (ICD-535.50) TUBULOVILLOUS ADENOMA, COLON (ICD-211.3) GASTROINTESTINAL HEMORRHAGE, HX OF (ICD-V12.79)-duodenal ulcer with hemorrhage in 1983 ENDOCARDITIS (ICD-424.90) 2002 MITRAL VALVE PROLAPSE (ICD-424.0) GOUT (ICD-274.9) ANXIETY (ICD-300.00) DEPRESSION (ICD-311) SLEEP APNEA (ICD-780.57) lumbar disk disease ([place]) ALLERGY (ICD-995.3) ALLERGIC Rhinitis HYPERCHOLESTEROLEMIA (ICD-272.0) HYPERTENSION (ICD-401.9) Atrial fibrillation-onset December 30, 2009  Past Surgical History: Last updated: December 21, 2009 Appendectomy Septoplasty 1982 Uvulopharyngoplasty Vasectomy 1976  colonoscopy 2007,2008, 2010  Stress test in December 2009  Family History: Last updated: 12-21-2009 Father- died CVA age 50, Mouth ca, COPD Mother-   h/o breast Ca, heart dis  one brother one sister  Social History:  Last updated: 12/20/2009 Married Smoked 1 PPD x 20 yrs, quit 1980's Regular exercise-no Business owner- renovates churches, administrative, much travel ETOH- 2-4 drinks almost daily  Risk Factors: Exercise: no  (10/08/2008)  Risk Factors: Smoking Status: quit > 6 months (01/25/2010) Packs/Day: 1.0 (01/25/2010)  Social History: Smoking Status:  quit > 6 months Pack years:  20 Packs/Day:  1.0  Review of Systems      See HPI       The patient complains of indigestion.  The patient denies shortness of breath with activity, shortness of breath at rest, productive cough, non-productive cough, coughing up blood, chest pain, irregular heartbeats, acid heartburn, loss of appetite, weight change, abdominal pain, difficulty swallowing, sore throat, tooth/dental problems, headaches, nasal congestion/difficulty breathing through nose, and sneezing.    Vital Signs:  Patient profile:   65 year old male Height:      75 inches Weight:      253 pounds O2 Sat:      96 % on Room air Pulse rate:   120 / minute BP sitting:   132 / 84  (right arm)  Vitals Entered By: Renold Genta RCP, LPN (January 25, 2010 9:52 AM)  O2 Flow:  Room air CC: 3 week follow up , review PFTs Comments Medications reviewed with patient Renold Genta RCP, LPN  January 25, 2010 9:56 AM    Physical Exam  Additional Exam:  General: A/Ox3; pleasant and cooperative, NAD, SKIN: no rash, lesions NODES: no lymphadenopathy HEENT: /AT, EOM- WNL, Conjuctivae- injected, PERRLA, TM-WNL, Nose-mucus bridging, Throat- clear and wnl, Mallampati  III NECK: Supple w/ fair ROM, JVD- none, normal carotid impulses w/o bruits Thyroid- normal to palpation CHEST: Clear now to P&A HEART: irregular, no m/g/r heard ABDOMEN: Soft and nl;  ZOX:WRUE, nl pulses, no edema  NEURO: Grossly intact to observation      Impression & Recommendations:  Problem # 1:  PNEUMONIA, RIGHT LOWER LOBE (ICD-486)  Almost completely resolved now. This should go on to clear without further attention  Problem # 2:  HEMOPTYSIS UNSPECIFIED (ICD-786.30)  Heme stopped when pneumonia cleared. He does remain on Warfarin for AF. updated medication list for this problem  includes:    Vitamin C 500 Mg Tabs (Ascorbic acid) ..... Qd  Problem # 3:  ALLERGIC RHINITIS (ICD-477.9) Minor problem now. He can come back to this is if needed later. The following medications were removed from the medication list:    Zyrtec Allergy 10 Mg Tabs (Cetirizine hcl) .Marland Kitchen... As needed His updated medication list for this problem includes:    Fexofenadine Hcl 180 Mg Tabs (Fexofenadine hcl) ..... One daily    Flonase 50 Mcg/act Susp (Fluticasone propionate) .Marland Kitchen... 1-2 sprays each nostril once daily at bedtime.  Problem # 4:  OBSTRUCTIVE SLEEP APNEA (ICD-327.23) He wanted instruction for his DME company so they could reset his spare CPAP tpo the same auto setting. If it isn't compatible, we will have it set to the goal determined by his latest download. He is stable now and compliannt with autoPAP. I will see him in 6 months unless needed sooner.  Medications Added to Medication List This Visit: 1)  Cardizem Cd 240 Mg Xr24h-cap (Diltiazem hcl coated beads) .Marland Kitchen.. 1 once daily  Other Orders: Est. Patient Level IV (45409)  Patient Instructions: 1)  Please schedule a follow-up appointment in 6 months. 2)   continue CPAP 3)  CC Dr Deborah Chalk

## 2010-08-18 NOTE — Assessment & Plan Note (Signed)
Summary: PERSISTENT COUGH ON TESSALON/PS   Vital Signs:  Patient profile:   65 year old male Weight:      247 pounds Temp:     97.9 degrees F oral BP sitting:   112 / 70  (left arm) Cuff size:   large  Vitals Entered By: Duard Brady LPN (Dec 02, 2009 8:23 AM) CC: c/o cough Is Patient Diabetic? No   CC:  c/o cough.  History of Present Illness: 9  -year-old patient who has a history of significant allergies that required immunotherapy as a young adult.  Since the fall, he has had a difficult time with mainly refractory cough.  symptoms seem worse at night when he is in a supine position.  He does describe some occasional mild rhinorrhea. His main complaint is severe proximal disease of coughing that last 10 minutes at times.  He has been on nasal steroids, oral antihistamines, and had minimal improvement with a brief course of steroids.  Anti-tussis in the have little benefit.  A chest x-ray was checked last year and was reportedly normal.  The patient does have hypertension, but he has been on samples of Benicar and has not started taking his ACE inhibitor as yet.  He has a history of gastroesophageal reflux disease and has been on chronic PPI therapy.  He has seen [facility] due to obstructive sleep apnea.  Allergies (verified): No Known Drug Allergies  Past History:  Past Medical History: Current Problems:  GERD (ICD-530.81) RESTLESS LEG SYNDROME (ICD-333.94) DIVERTICULOSIS, COLON (ICD-562.10) GASTRITIS (ICD-535.50) TUBULOVILLOUS ADENOMA, COLON (ICD-211.3) GASTROINTESTINAL HEMORRHAGE, HX OF (ICD-V12.79)-duodenal ulcer with hemorrhage in 1983 ENDOCARDITIS (ICD-424.90) 2002 MITRAL VALVE PROLAPSE (ICD-424.0) GOUT (ICD-274.9) ANXIETY (ICD-300.00) DEPRESSION (ICD-311) SLEEP APNEA (ICD-780.57) lumbar disk disease ([place]) ALLERGY (ICD-995.3) HYPERCHOLESTEROLEMIA (ICD-272.0) HYPERTENSION (ICD-401.9) Allergic rhinitis  Review of Systems       The patient  complains of prolonged cough.  The patient denies anorexia, fever, weight loss, weight gain, vision loss, decreased hearing, hoarseness, chest pain, syncope, dyspnea on exertion, peripheral edema, headaches, hemoptysis, abdominal pain, melena, hematochezia, severe indigestion/heartburn, hematuria, incontinence, genital sores, muscle weakness, suspicious skin lesions, transient blindness, difficulty walking, depression, unusual weight change, abnormal bleeding, enlarged lymph nodes, angioedema, breast masses, and testicular masses.    Physical Exam  General:  overweight-appearing.  normal blood pressureoverweight-appearing.   Head:  Normocephalic and atraumatic without obvious abnormalities. No apparent alopecia or balding. Eyes:  No corneal or conjunctival inflammation noted. EOMI. Perrla. Funduscopic exam benign, without hemorrhages, exudates or papilledema. Vision grossly normal. Ears:  External ear exam shows no significant lesions or deformities.  Otoscopic examination reveals clear canals, tympanic membranes are intact bilaterally without bulging, retraction, inflammation or discharge. Hearing is grossly normal bilaterally. Mouth:  Oral mucosa and oropharynx without lesions or exudates.  Teeth in good repair. Neck:  No deformities, masses, or tenderness noted. Lungs:  Normal respiratory effort, chest expands symmetrically. Lungs are clear to auscultation, no crackles or wheezes. Heart:  Normal rate and regular rhythm. S1 and S2 normal without gallop, murmur, click, rub or other extra sounds.   Impression & Recommendations:  Problem # 1:  ALLERGIC RHINITIS (ICD-477.9)  His updated medication list for this problem includes:    Zyrtec Allergy 10 Mg Tabs (Cetirizine hcl) .Marland Kitchen... As needed    Fexofenadine Hcl 180 Mg Tabs (Fexofenadine hcl) ..... One daily    Fluticasone Propionate 50 Mcg/act Susp (Fluticasone propionate) ..... Use daily    His updated medication list for this problem  includes:  Zyrtec Allergy 10 Mg Tabs (Cetirizine hcl) .Marland Kitchen... As needed    Fexofenadine Hcl 180 Mg Tabs (Fexofenadine hcl) ..... One daily    Fluticasone Propionate 50 Mcg/act Susp (Fluticasone propionate) ..... Use daily  Orders: Pulmonary Referral (Pulmonary)  Problem # 2:  HYPERTENSION (ICD-401.9)  His updated medication list for this problem includes:    Benazepril Hcl 40 Mg Tabs (Benazepril hcl) ..... One daily patient presently is using samples of Benicar.  Will consider avoiding ACE inhibition due to his refractory cough  His updated medication list for this problem includes:    Benazepril Hcl 40 Mg Tabs (Benazepril hcl) ..... One daily  Complete Medication List: 1)  Xanax 1 Mg Tabs (Alprazolam) .... Once daily 2)  Zyrtec Allergy 10 Mg Tabs (Cetirizine hcl) .... As needed 3)  Adult Aspirin Low Strength 81 Mg Tbdp (Aspirin) .... Once daily 4)  Allopurinol 300 Mg Tabs (Allopurinol) .... Once daily 5)  Colchicine 0.6 Mg Tabs (Colchicine) .... One tablet four times daily until gout resolves. 6)  Omeprazole 40 Mg Cpdr (Omeprazole) .Marland Kitchen.. 1 by mouth daily 7)  Requip 0.5 Mg Tabs (Ropinirole hcl) .... One by mouth at bedtime 8)  Niaspan 500 Mg Cr-tabs (Niacin (antihyperlipidemic)) .... 2  by mouth daily 9)  Hydrocodone-homatropine 5-1.5 Mg/65ml Syrp (Hydrocodone-homatropine) .Marland Kitchen.. 1 teaspoon every 6 hours as needed for cough 10)  Fish Oil 1000 Mg Caps (Omega-3 fatty acids) .... Bid 11)  Vitamin C 500 Mg Tabs (Ascorbic acid) .... Qd 12)  Citalopram Hydrobromide 40 Mg Tabs (Citalopram hydrobromide) .... One daily 13)  Fexofenadine Hcl 180 Mg Tabs (Fexofenadine hcl) .... One daily 14)  Simvastatin 40 Mg Tabs (Simvastatin) .... One daily 15)  Benazepril Hcl 40 Mg Tabs (Benazepril hcl) .... One daily 16)  Prednisone 20 Mg Tabs (Prednisone) .... One twice daily for 7 days, then one daily for 7 days 17)  Fexofenadine-pseudoephedrine 60-120 Mg Xr12h-tab (Fexofenadine-pseudoephedrine) .... One  twice daily as needed 18)  Tessalon 200 Mg Caps (Benzonatate) .Marland Kitchen.. 1 by mouth three times a day prn 19)  Fluticasone Propionate 50 Mcg/act Susp (Fluticasone propionate) .... Use daily  Patient Instructions: 1)  follow-up pulmonary medicine, as scheduled 2)  continue Benicar 40 mg daily 3)  continue antihistamines and nasal spray

## 2010-08-18 NOTE — Progress Notes (Signed)
Summary: results  Phone Note Call from Patient Call back at Home Phone [phone]   Caller: Patient Call For: young Reason for Call: Talk to Nurse, Lab or Test Results Summary of Call: pt called to get results of CXR Initial call taken by: Eugene Gavia,  January 07, 2010 10:27 AM  Follow-up for Phone Call        pt calling for cxr results from 01-04-2010.  results unsigned.  please advise.  Aundra Millet Reynolds LPN  January 07, 2010 10:43 AM   Additional Follow-up for Phone Call Additional follow up Details #1::        CXR is actually a little worse, with some fluid collection. Not dra matic, but I can't tell from CXR exactly what is going on.  I would like to get a CT with contrast and repeat a CBC with BMP and D Dimer. has there been any more blood? Additional Follow-up by: Waymon Budge MD,  January 07, 2010 1:40 PM    Additional Follow-up for Phone Call Additional follow up Details #2::    SPOKE WITH [facility] IN [facility] WILL DRAWN OVER THERE PT AWARE. Follow-up by: Lacinda Axon,  January 07, 2010 3:55 PM

## 2010-08-18 NOTE — Progress Notes (Signed)
Summary: please verify rx  Phone Note From Pharmacy Call back at [phone]   Caller: gina[facility]      voice mail Summary of Call: please verify directions on Niaspan er 500mg  two times a day. In the past, records show 1 daily. Initial call taken by: Warnell Forester,  May 04, 2010 10:39 AM  Follow-up for Phone Call        called [facility] - 11/23/09 was increased to 2 pills once daily   KIK Follow-up by: Duard Brady LPN,  May 04, 2010 11:20 AM

## 2010-08-18 NOTE — Progress Notes (Signed)
Summary: niaspan ER   Phone Note From Pharmacy   Caller: ra [phone] Summary of Call: Re Niaspan ER 500mg  quantity #180 one daily.  Clarify directions?  Is he taking 2 a day? Initial call taken by: Rudy Jew, RN,  Nov 23, 2009 10:51 AM  Follow-up for Phone Call        two daily Follow-up by: [name]  MD,  Nov 23, 2009 12:20 PM  Additional Follow-up for Phone Call Additional follow up Details #1::        Called and left message at drugstore. Additional Follow-up by: Rudy Jew, RN,  Nov 23, 2009 4:42 PM

## 2010-08-18 NOTE — Progress Notes (Signed)
Summary: REQ FOR LABWORK  Phone Note Call from Patient   Caller: Patient   Summary of Call: Pt called to state that [facility] (Dr Deborah Chalk)  is wanting him to have a Kidney Function Panel (already part of [facility] labs?) and he would like to have same done when he comes into [facility] for [facility]....? Other orders?  Initial call taken by: Debbra Riding,  April 06, 2010 2:30 PM  Follow-up for Phone Call        this is part of routine lab Follow-up by: [name]  MD,  April 07, 2010 8:15 AM  Additional Follow-up for Phone Call Additional follow up Details #1::        Pt adv that labs that Dr Deborah Chalk wanted ordered are included in [facility] labs as previously advised.... No indication of any additional orders other than cpx labs...Marland KitchenMarland KitchenPt acknowledged same.   Additional Follow-up by: Debbra Riding,  April 07, 2010 8:32 AM

## 2010-08-18 NOTE — Assessment & Plan Note (Signed)
Summary: cpx/njr/pt rescd//ccm   Vital Signs:  Patient profile:   65 year old male Height:      73 inches Weight:      244 pounds BMI:     32.31 Temp:     98.5 degrees F oral BP sitting:   122 / 82  (right arm) Cuff size:   regular  Vitals Entered By: Duard Brady LPN (April 28, 2010 8:36 AM) CC: cpx - doing well Is Patient Diabetic? No   Primary Care Provider:  [name]  MD  CC:  cpx - doing well.  History of Present Illness: 65 year old patient who is seen today for a comprehensive evaluation.  His Foley closely by cardiology due to paroxysmal atrial for ablation.  He is on chronic Coumadin anticoagulation.  He has recently diagnosed with a melanoma involving his upper back area.  He is scheduled for further extensive surgery, but apparently has very favorable histology.  He has treated hypertension, and dyslipidemia.  He has obstructive sleep apnea, and remote history of endocarditis in 2002.  Allergies (verified): No Known Drug Allergies  Past History:  Past Medical History: GERD (ICD-530.81) RESTLESS LEG SYNDROME (ICD-333.94) DIVERTICULOSIS, COLON (ICD-562.10) GASTRITIS (ICD-535.50) TUBULOVILLOUS ADENOMA, COLON (ICD-211.3) GASTROINTESTINAL HEMORRHAGE, HX OF (ICD-V12.79)-duodenal ulcer with hemorrhage in 1983 ENDOCARDITIS (ICD-424.90) 2002 MITRAL VALVE PROLAPSE (ICD-424.0) GOUT (ICD-274.9) ANXIETY (ICD-300.00) DEPRESSION (ICD-311) SLEEP APNEA (ICD-780.57) lumbar disk disease ([place]) ALLERGY (ICD-995.3) ALLERGIC Rhinitis HYPERCHOLESTEROLEMIA (ICD-272.0) HYPERTENSION (ICD-401.9) Atrial fibrillation-onset December 30, 2009 impaired glucose tolerance  Past Surgical History: Reviewed history from 12/20/2009 and no changes required. Appendectomy Septoplasty 1982 Uvulopharyngoplasty Vasectomy 1976  colonoscopy 2007,2008, 2010  Stress test in December 2009  Family History: Reviewed history from 12/20/2009 and no changes required. Father-  died CVA age 60, Mouth ca, COPD Mother-   h/o breast Ca, heart dis  one brother- obesity, LBP one sister  Social History: Reviewed history from 12/20/2009 and no changes required. Married Smoked 1 PPD x 20 yrs, quit 1980's Regular exercise-no Business owner- renovates churches, administrative, much travel ETOH- 2-4 drinks almost daily  Review of Systems       The patient complains of suspicious skin lesions.  The patient denies anorexia, fever, weight loss, weight gain, vision loss, decreased hearing, hoarseness, chest pain, syncope, dyspnea on exertion, peripheral edema, prolonged cough, headaches, hemoptysis, abdominal pain, melena, hematochezia, severe indigestion/heartburn, hematuria, incontinence, genital sores, muscle weakness, transient blindness, difficulty walking, depression, unusual weight change, abnormal bleeding, enlarged lymph nodes, angioedema, breast masses, and testicular masses.    Physical Exam  General:  overweight-appearing. 120/82 Head:  Normocephalic and atraumatic without obvious abnormalities. No apparent alopecia or balding. Eyes:  No corneal or conjunctival inflammation noted. EOMI. Perrla. Funduscopic exam benign, without hemorrhages, exudates or papilledema. Vision grossly normal. Ears:  External ear exam shows no significant lesions or deformities.  Otoscopic examination reveals clear canals, tympanic membranes are intact bilaterally without bulging, retraction, inflammation or discharge. Hearing is grossly normal bilaterally. Mouth:  Oral mucosa and oropharynx without lesions or exudates.  Teeth in good repair. Neck:  No deformities, masses, or tenderness noted. Chest Wall:  No deformities, masses, tenderness or gynecomastia noted. Breasts:  No masses or gynecomastia noted Lungs:  Normal respiratory effort, chest expands symmetrically. Lungs are clear to auscultation, no crackles or wheezes.  few crackles, right base Heart:  irregular rhythm grade 2/6  systolic murmur controlled ventricular response Abdomen:  obese soft and nontender faint epigastric and bilateral femoral bruits no organomegaly or masses Rectal:  No external abnormalities noted. Normal sphincter tone. No rectal masses or tenderness. Genitalia:  Testes bilaterally descended without nodularity, tenderness or masses. No scrotal masses or lesions. No penis lesions or urethral discharge. Prostate:  1+ enlarged.  1+ enlarged.   Msk:  No deformity or scoliosis noted of thoracic or lumbar spine.   Pulses:  R and L carotid,radial,femoral,dorsalis pedis and posterior tibial pulses are full and equal bilaterally Extremities:  No clubbing, cyanosis, edema, or deformity noted with normal full range of motion of all joints.   Neurologic:  No cranial nerve deficits noted. Station and gait are normal. Plantar reflexes are down-going bilaterally. DTRs are symmetrical throughout. Sensory, motor and coordinative functions appear intact. Skin:  Intact without suspicious lesions or rashes Cervical Nodes:  No lymphadenopathy noted Axillary Nodes:  No palpable lymphadenopathy Inguinal Nodes:  No significant adenopathy Psych:  Cognition and judgment appear intact. Alert and cooperative with normal attention span and concentration. No apparent delusions, illusions, hallucinations   Impression & Recommendations:  Problem # 1:  PREVENTIVE HEALTH CARE (ICD-V70.0)  Complete Medication List: 1)  Xanax 1 Mg Tabs (Alprazolam) .... Once daily 2)  Adult Aspirin Low Strength 81 Mg Tbdp (Aspirin) .... Once daily 3)  Allopurinol 300 Mg Tabs (Allopurinol) .... Once daily 4)  Omeprazole 40 Mg Cpdr (Omeprazole) .Marland Kitchen.. 1 by mouth daily 5)  Niaspan 500 Mg Cr-tabs (Niacin (antihyperlipidemic)) .... 2  by mouth daily 6)  Fish Oil 1000 Mg Caps (Omega-3 fatty acids) .... Bid 7)  Vitamin C 500 Mg Tabs (Ascorbic acid) .... Qd 8)  Citalopram Hydrobromide 40 Mg Tabs (Citalopram hydrobromide) .... One daily 9)   Fexofenadine Hcl 180 Mg Tabs (Fexofenadine hcl) .... One daily 10)  Warfarin Sodium 5 Mg Tabs (Warfarin sodium) .Marland Kitchen.. 1 to 1-1/2 tabs by mouth once daily 11)  Cpap Machine Autopap 5-15, Humidifier  .... Intolerant of fixed cpap 12)  Cardizem Cd 240 Mg Xr24h-cap (Diltiazem hcl coated beads) .Marland Kitchen.. 1 once daily 13)  Crestor 10 Mg Tabs (Rosuvastatin calcium) .... Qd 14)  Amoxicillin 500 Mg Caps (Amoxicillin) .... Prior to dental procedures 15)  Colcrys 0.6 Mg Tabs (Colchicine) .Marland Kitchen.. 1 by mouth qid as needed gout pain 16)  Benicar 40 Mg Tabs (Olmesartan medoxomil) .... Qd 17)  Furosemide 20 Mg Tabs (Furosemide) .Marland Kitchen.. 1 by mouth mon - wed- fri  Patient Instructions: 1)  Please schedule a follow-up appointment in 4 months. 2)  Limit your Sodium (Salt) to less than 2 grams a day(slightly less than 1/2 a teaspoon) to prevent fluid retention, swelling, or worsening of symptoms. 3)  It is important that you exercise regularly at least 20 minutes 5 times a week. If you develop chest pain, have severe difficulty breathing, or feel very tired , stop exercising immediately and seek medical attention. 4)  You need to lose weight. Consider a lower calorie diet and regular exercise.  Prescriptions: FUROSEMIDE 20 MG TABS (FUROSEMIDE) 1 by mouth mon - wed- fri  #90 x 6   Entered and Authorized by:   [name]  MD   Signed by:   [name]  MD on 04/28/2010   Method used:   Print then Give to Patient   RxID:   [id number] BENICAR 40 MG TABS (OLMESARTAN MEDOXOMIL) qd  #90 x 5   Entered and Authorized by:   [name]  MD   Signed by:   [name]  MD on 04/28/2010   Method used:   Print then  Give to Patient   RxID:   [id number] CRESTOR 10 MG TABS (ROSUVASTATIN CALCIUM) qd  #90 x 5   Entered and Authorized by:   [name]  MD   Signed by:   [name]  MD on 04/28/2010   Method used:   Print then Give to Patient   RxID:    [id number] CARDIZEM CD 240 MG XR24H-CAP (DILTIAZEM HCL COATED BEADS) 1 once daily  #90 x 2   Entered and Authorized by:   [name]  MD   Signed by:   [name]  MD on 04/28/2010   Method used:   Print then Give to Patient   RxID:   [id number] FEXOFENADINE HCL 180 MG TABS (FEXOFENADINE HCL) one daily  #90 x 6   Entered and Authorized by:   [name]  MD   Signed by:   [name]  MD on 04/28/2010   Method used:   Print then Give to Patient   RxID:   [id number] CITALOPRAM HYDROBROMIDE 40 MG TABS (CITALOPRAM HYDROBROMIDE) one daily  #90 x 6   Entered and Authorized by:   [name]  MD   Signed by:   [name]  MD on 04/28/2010   Method used:   Print then Give to Patient   RxID:   [id number] OMEPRAZOLE 40 MG  CPDR (OMEPRAZOLE) 1 by mouth daily  #90 x 6   Entered and Authorized by:   [name]  MD   Signed by:   [name]  MD on 04/28/2010   Method used:   Print then Give to Patient   RxID:   [id number] ALLOPURINOL 300 MG  TABS (ALLOPURINOL) once daily  #90 x 6   Entered and Authorized by:   [name]  MD   Signed by:   [name]  MD on 04/28/2010   Method used:   Print then Give to Patient   RxID:   [id number] Prudy Feeler 1 MG  TABS (ALPRAZOLAM) once daily  #90 x 4   Entered and Authorized by:   [name]  MD   Signed by:   [name]  MD on 04/28/2010   Method used:   Print then Give to Patient   RxID:   [id number]

## 2010-08-18 NOTE — Assessment & Plan Note (Signed)
Summary: coughing up blood//jrc   Copy to:  Dr Amador Cunas Primary Provider/Referring Provider:  [name]  MD   History of Present Illness: 2009-12-31- 63 yom referred for persistent cough with new onset hemoptysis. He only admitted as we were finishing, that he had been coughng streaks of red blood in mucus over 3 days, with none in last 3 days. He has coughed for several months. He said this had been a bad year for allergies, initially implying he felt his was an allergy problem. Throat tickle cough with no wheeze, chest pain, nodes, fever. More aware of exertional dyspnea on stairs and hills and weight up 25 lbs in past year. Allergic rhinitis since age 19. Allergy vaccine remotely, directed at common inhalants. Perennial cough and postnasal drip, worst in Spring and Fall. No hx of asthma. Recently changed Benicar to Benazepril and Zyrtec to Allegra-D 24 hr for his insurance HSA.  Says Zyrtec and Allegra-D not good enough. CXR 08/26/09- shallow inspriation, NAD. Hx OSA/ CPAP- Dr Craige Cotta- used all night every night. Hx LAUP. Hx GERD- controlled. 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 hx treadmill 2010 Dr Deborah Chalk "OK".   Preventive Screening-Counseling & Management  Alcohol-Tobacco     Smoking Status: quit > 6 months  Current Medications (verified): 1)  Xanax 1 Mg  Tabs (Alprazolam) .... Once Daily 2)  Zyrtec Allergy 10 Mg  Tabs (Cetirizine Hcl) .... As Needed 3)  Adult Aspirin Low Strength 81 Mg  Tbdp (Aspirin) .... Once Daily 4)  Allopurinol 300 Mg  Tabs (Allopurinol) .... Once Daily 5)  Colchicine 0.6 Mg  Tabs (Colchicine) .... One Tablet Four Times Daily Until Gout Resolves. 6)  Omeprazole 40 Mg  Cpdr (Omeprazole) .Marland Kitchen.. 1 By Mouth Daily 7)  Niaspan 500 Mg Cr-Tabs (Niacin (Antihyperlipidemic)) .... 2  By Mouth Daily 8)  Hydrocodone-Homatropine 5-1.5 Mg/84ml Syrp (Hydrocodone-Homatropine) .Marland Kitchen.. 1 Teaspoon Every 6 Hours As Needed For Cough 9)  Fish  Oil 1000 Mg Caps (Omega-3 Fatty Acids) .... Bid 10)  Vitamin C 500 Mg Tabs (Ascorbic Acid) .... Qd 11)  Citalopram Hydrobromide 40 Mg Tabs (Citalopram Hydrobromide) .... One Daily 12)  Fexofenadine Hcl 180 Mg Tabs (Fexofenadine Hcl) .... One Daily 13)  Simvastatin 40 Mg Tabs (Simvastatin) .... One Daily 14)  Benazepril Hcl 40 Mg Tabs (Benazepril Hcl) .... One Daily 15)  Tessalon 200 Mg Caps (Benzonatate) .Marland Kitchen.. 1 By Mouth Three Times A Day Prn 16)  Fluticasone Propionate 50 Mcg/act Susp (Fluticasone Propionate) .... Use Daily  Allergies (verified): No Known Drug Allergies  Past History:  Family History: Last updated: 2009/12/31 Father- died CVA age 73, Mouth ca, COPD Mother-   h/o breast Ca, heart dis  one brother one sister  Social History: Last updated: 31-Dec-2009 Married Smoked 1 PPD x 20 yrs, quit 1980's Regular exercise-no Business owner- renovates churches, administrative, much travel ETOH- 2-4 drinks almost daily  Risk Factors: Exercise: no (10/08/2008)  Risk Factors: Smoking Status: quit > 6 months (Dec 31, 2009)  Past Medical History: GERD (ICD-530.81) RESTLESS LEG SYNDROME (ICD-333.94) DIVERTICULOSIS, COLON (ICD-562.10) GASTRITIS (ICD-535.50) TUBULOVILLOUS ADENOMA, COLON (ICD-211.3) GASTROINTESTINAL HEMORRHAGE, HX OF (ICD-V12.79)-duodenal ulcer with hemorrhage in 1983 ENDOCARDITIS (ICD-424.90) 2002 MITRAL VALVE PROLAPSE (ICD-424.0) GOUT (ICD-274.9) ANXIETY (ICD-300.00) DEPRESSION (ICD-311) SLEEP APNEA (ICD-780.57) lumbar disk disease ([place]) ALLERGY (ICD-995.3) ALLERGIC Rhinitis HYPERCHOLESTEROLEMIA (ICD-272.0) HYPERTENSION (ICD-401.9)  Past Surgical History: Appendectomy Septoplasty 1982 Uvulopharyngoplasty Vasectomy 1976  colonoscopy 2007,2008, 2010  Stress test in 07/08/2008 Family History: Father- died CVA  age 16, Mouth ca, COPD Mother-   h/o breast Ca, heart dis  one brother one sister  Social History: Married Smoked 1 PPD x  20 yrs, quit 1980's Regular exercise-no Psychologist, sport and exercise- Marketing executive churches, Designer, industrial/product, much travel ETOH- 2-4 drinks almost daily Smoking Status:  quit > 6 months  Review of Systems       The patient complains of shortness of breath with activity, productive cough, non-productive cough, coughing up blood, weight change, sneezing, anxiety, depression, and change in color of mucus.  The patient denies shortness of breath at rest, acid heartburn, indigestion, abdominal pain, difficulty swallowing, sore throat, headaches, itching, ear ache, hand/feet swelling, joint stiffness or pain, rash, and fever.    Vital Signs:  Patient profile:   65 year old male Height:      75 inches Weight:      256 pounds O2 Sat:      98 % on Room air Pulse rate:   118 / minute BP sitting:   120 / 70  (left arm) Cuff size:   large  Vitals Entered By: Renold Genta RCP, LPN (December 21, 8754 11:42 AM)  O2 Flow:  Room air  Physical Exam  Additional Exam:  General: A/Ox3; pleasant and cooperative, NAD, SKIN: no rash, lesions NODES: no lymphadenopathy HEENT: [place]/AT, EOM- WNL, Conjuctivae- injected, PERRLA, TM-WNL, Nose-mucus bridging, Throat- clear and wnl, Mallampati  III NECK: Supple w/ fair ROM, JVD- none, normal carotid impulses w/o bruits Thyroid- normal to palpation CHEST: rattle cough once, otherwise clear HEART: RRR, no m/g/r heard ABDOMEN: Soft and nl; nml bowel sounds; no organomegaly or masses noted, overweight EPP:IRJJ, nl pulses, no edema  NEURO: Grossly intact to observation      Impression & Recommendations:  Problem # 1:  ALLERGIC RHINITIS (ICD-477.9) Use fluticasone every night, continue antihistamine. We will assay IgE status and consider skin testing if appropriate. His updated medication list for this problem includes:    Zyrtec Allergy 10 Mg Tabs (Cetirizine hcl) .Marland Kitchen... As needed    Fexofenadine Hcl 180 Mg Tabs (Fexofenadine hcl) ..... One daily    Flonase 50 Mcg/act Susp  (Fluticasone propionate) .Marland Kitchen... 1-2 sprays each nostril once daily at bedtime.  Problem # 2:  DYSPNEA ON EXERTION (ICD-786.09)  He has a significant remote smoking hx. We will get PFT. Understands role of conditioning and weight gain. His updated medication list for this problem includes:    Benazepril Hcl 40 Mg Tabs (Benazepril hcl) ..... One daily  Problem # 3:  COUGH (ICD-786.2) We have discussed cough, predating his ACE inhibitor, as potentially multifactorial. PFT appropriate. Will see if allergy, reflux, bronchitis or new process is important.  Problem # 4:  HEMOPTYSIS UNSPECIFIED (ICD-786.30) New problem, self limited. Reassuring unremarkable CXR in February, and bleeding has stopped, but he smoked for a long time remotely. We will consider whether to CT chest. For now we will update CXR. The following medications were removed from the medication list:    Prednisone 20 Mg Tabs (Prednisone) ..... One twice daily for 7 days, then one daily for 7 days His updated medication list for this problem includes:    Vitamin C 500 Mg Tabs (Ascorbic acid) ..... Qd  Medications Added to Medication List This Visit: 1)  Flonase 50 Mcg/act Susp (Fluticasone propionate) .Marland Kitchen.. 1-2 sprays each nostril once daily at bedtime.  Other Orders: Consultation Level IV (88416) T-Allergy Profile [place], [place], [place], [place], [place] [phone]) Prescription Created Electronically [id number]) T-2 View CXR (71020TC)  Patient Instructions: 1)  Schedule return for allergy skin testing. Stop all antihistamines 3 days before skin testing, including cold and allergy meds, otc sleep and cough meds.  2)  Lab 3)  Schedule PFT 4)  Watch Benazepril as a possible cause of persistent dry cough in the future. 5)  Watch for reflux as a reason for cough and wet throat now. 6)  Try using the fluticasone nasal spray: 2 sprays each nostril every night at bedtime. 7)  A chest x-ray has been recommended.  Your imaging study may require  preauthorization.  Prescriptions: FLONASE 50 MCG/ACT SUSP (FLUTICASONE PROPIONATE) 1-2 sprays each nostril once daily at bedtime.  #1 x prn   Entered and Authorized by:   Waymon Budge MD   Signed by:   Waymon Budge MD on 12/20/2009   Method used:   Electronically to        [facility]. [phone]* (retail)       [phone]       [place], [place]  [address]       Ph: [phone]       Fax: [phone]   RxID:   [id number]

## 2010-08-18 NOTE — Progress Notes (Signed)
Summary: allergy testing appt  Phone Note Call from Patient Call back at Home Phone [phone]   Caller: Patient Call For: young Reason for Call: Talk to Nurse Summary of Call: pt needs to come in for Allergy Testing.  First available is 09/07 @ 4:00pm.  Pt will be out of town that week.  He doesn't want to wait until October.  Can you please find him and Allergy testing time he can come to before this? Initial call taken by: Eugene Gavia,  December 20, 2009 1:34 PM  Follow-up for Phone Call        Please advise, thanks! [name]  December 20, 2009 2:36 PM   Pt to come in on 01-04-10 at 11am to have skin testing and repeat CXR.[name] CMA  December 22, 2009 9:05 AM

## 2010-08-18 NOTE — Progress Notes (Signed)
Summary: pfts  Phone Note Call from Patient   Caller: [name] Call For: young Summary of Call: pt wants pft results Initial call taken by: Oneita Jolly,  January 14, 2010 10:31 AM  Follow-up for Phone Call        PFT was done on 01/10/2010 and would like results. Please advise. [name] CMA  January 14, 2010 10:41 AM  Additional Follow-up for Phone Call Additional follow up Details #1::        Pt is aware of results and PFT sent to HIM to have scanned in EMR.[name] CMA  January 19, 2010 5:24 PM

## 2010-08-18 NOTE — Progress Notes (Signed)
Summary: xray results  Phone Note Call from Patient   Caller: Patient Call For: Dr. Maple Hudson Summary of Call: Patient phoned and would like the results of his chest xray from a week ago on Tuesday. Patient can be reached [phone] Initial call taken by: Vedia Coffer,  August 03, 2010 9:58 AM  Follow-up for Phone Call        Called, spoke with pt.  He was informed of cxr results as stated in append from 07/26/10 by CY and verbalized understanding. Follow-up by: Gweneth Dimitri RN,  August 03, 2010 11:24 AM

## 2010-08-18 NOTE — Assessment & Plan Note (Signed)
Summary: sore throat/cough/chest congestion/cjr   Vital Signs:  Patient profile:   65 year old male Weight:      253 pounds Temp:     98.2 degrees F oral BP sitting:   120 / 70  (left arm) Cuff size:   regular  Vitals Entered By: Duard Brady LPN (February 07, 2010 11:03 AM) CC: c/o sinus drainage , cough , sore throat Is Patient Diabetic? No   Primary Care Provider:  [name]  MD  CC:  c/o sinus drainage , cough , and sore throat.  History of Present Illness: 65 year old patient who was treated about one month ago for a right lower lobe pneumonia.  This was confirmed radiographically with a chest x-ray and also the patient had a chest CTA  to exclude pulmonary embolism  which confirmed the infiltrate.  He was treated with azithromycin for community-acquired pneumonia.  When seen in pulmonary follow-up.  Recently, chest x-ray was clear to auscultation.  For the past two days.  He has had increasing chest congestion and cough.  There has been no sputum production, fever or chills.  He has had minimal sore throat and postnasal drip.  Due to his prior history of endocarditis, as well as prior history of pneumonia.  He was concerned about the recent symptoms.  He has atrial fibrillation and now on Coumadin anticoagulation for about two months.  He is scheduled for cardiology follow-up in 3 days  Allergies (verified): No Known Drug Allergies  Past History:  Past Medical History: Reviewed history from 12/30/2009 and no changes required. GERD (ICD-530.81) RESTLESS LEG SYNDROME (ICD-333.94) DIVERTICULOSIS, COLON (ICD-562.10) GASTRITIS (ICD-535.50) TUBULOVILLOUS ADENOMA, COLON (ICD-211.3) GASTROINTESTINAL HEMORRHAGE, HX OF (ICD-V12.79)-duodenal ulcer with hemorrhage in 1983 ENDOCARDITIS (ICD-424.90) 2002 MITRAL VALVE PROLAPSE (ICD-424.0) GOUT (ICD-274.9) ANXIETY (ICD-300.00) DEPRESSION (ICD-311) SLEEP APNEA (ICD-780.57) lumbar disk disease ([place]) ALLERGY  (ICD-995.3) ALLERGIC Rhinitis HYPERCHOLESTEROLEMIA (ICD-272.0) HYPERTENSION (ICD-401.9) Atrial fibrillation-onset December 30, 2009  Review of Systems       The patient complains of prolonged cough.  The patient denies anorexia, fever, weight loss, weight gain, vision loss, decreased hearing, hoarseness, chest pain, syncope, dyspnea on exertion, peripheral edema, headaches, hemoptysis, abdominal pain, melena, hematochezia, severe indigestion/heartburn, hematuria, incontinence, genital sores, muscle weakness, suspicious skin lesions, transient blindness, difficulty walking, depression, unusual weight change, abnormal bleeding, enlarged lymph nodes, angioedema, breast masses, and testicular masses.    Physical Exam  General:  Well-developed,well-nourished,in no acute distress; alert,appropriate and cooperative throughout examination; blood pressure, normal Head:  Normocephalic and atraumatic without obvious abnormalities. No apparent alopecia or balding. Ears:  External ear exam shows no significant lesions or deformities.  Otoscopic examination reveals clear canals, tympanic membranes are intact bilaterally without bulging, retraction, inflammation or discharge. Hearing is grossly normal bilaterally. Mouth:  Oral mucosa and oropharynx without lesions or exudates.  Teeth in good repair. Neck:  No deformities, masses, or tenderness noted. Lungs:  few crackles at the right base Heart:  an irregular rhythm with a grade 3/6 systolic murmur Abdomen:  Bowel sounds positive,abdomen soft and non-tender without masses, organomegaly or hernias noted.   Impression & Recommendations:  Problem # 1:  URI (ICD-465.9)  His updated medication list for this problem includes:    Adult Aspirin Low Strength 81 Mg Tbdp (Aspirin) ..... Once daily    Fexofenadine Hcl 180 Mg Tabs (Fexofenadine hcl) ..... One daily    Hydrocodone-homatropine 5-1.5 Mg/109ml Syrp (Hydrocodone-homatropine) .Marland Kitchen... 1 teaspoon every 6 hours as  needed for cough  Problem # 2:  COUGH (  VWU-981.1)  Complete Medication List: 1)  Xanax 1 Mg Tabs (Alprazolam) .... Once daily 2)  Adult Aspirin Low Strength 81 Mg Tbdp (Aspirin) .... Once daily 3)  Allopurinol 300 Mg Tabs (Allopurinol) .... Once daily 4)  Omeprazole 40 Mg Cpdr (Omeprazole) .Marland Kitchen.. 1 by mouth daily 5)  Niaspan 500 Mg Cr-tabs (Niacin (antihyperlipidemic)) .... 2  by mouth daily 6)  Fish Oil 1000 Mg Caps (Omega-3 fatty acids) .... Bid 7)  Vitamin C 500 Mg Tabs (Ascorbic acid) .... Qd 8)  Citalopram Hydrobromide 40 Mg Tabs (Citalopram hydrobromide) .... One daily 9)  Fexofenadine Hcl 180 Mg Tabs (Fexofenadine hcl) .... One daily 10)  Flonase 50 Mcg/act Susp (Fluticasone propionate) .Marland Kitchen.. 1-2 sprays each nostril once daily at bedtime. 11)  Warfarin Sodium 5 Mg Tabs (Warfarin sodium) .Marland Kitchen.. 1 to 1-1/2 tabs by mouth once daily 12)  Cpap Machine Autopap 5-15, Humidifier  .... Intolerant of fixed cpap 13)  Cpap Autopap 5-15  .... May set either auto or fixed pressure 11, if machine can't auto. 14)  Cardizem Cd 240 Mg Xr24h-cap (Diltiazem hcl coated beads) .Marland Kitchen.. 1 once daily 15)  Hydrocodone-homatropine 5-1.5 Mg/27ml Syrp (Hydrocodone-homatropine) .Marland Kitchen.. 1 teaspoon every 6 hours as needed for cough  Patient Instructions: 1)  Get plenty of rest, drink lots of clear liquids, and use Tylenol  for fever and comfort. Return in 7-10 days if you're not better:sooner if you're feeling worse. Prescriptions: HYDROCODONE-HOMATROPINE 5-1.5 MG/5ML SYRP (HYDROCODONE-HOMATROPINE) 1 teaspoon every 6 hours as needed for cough  #6 oz x 0   Entered and Authorized by:   [name]  MD   Signed by:   [name]  MD on 02/07/2010   Method used:   Print then Give to Patient   RxID:   [id number]

## 2010-08-18 NOTE — Progress Notes (Signed)
Summary: CPAP autotitration download.  Phone Note Other Incoming   Summary of Call: Advanced- CPAP titrated to 11 with good compliance and control. He had preferred the self adjusting autotitration when first presented, but the fixed pressure seems to do well. Would he like to try chnaging to a fixed pressure of 11 or stay with his autotitration setting? Initial call taken by: Waymon Budge MD,  January 19, 2010 9:31 AM  Follow-up for Phone Call        Pt would like to keep setting as it is now. Reynaldo Minium CMA  January 19, 2010 5:14 PM   Pt would also like to have RX written or electronically printed to give [facility] to state that they should service and keep his older machines at autotitration (setting now).Reynaldo Minium CMA  January 19, 2010 5:24 PM   Additional Follow-up for Phone Call Additional follow up Details #1::        Done- we can give it to him    New/Updated Medications: * CPAP AUTOPAP 5-15 May set either Auto or fixed pressure 11, if machine can't auto. Prescriptions: CPAP AUTOPAP 5-15 May set either Auto or fixed pressure 11, if machine can't auto.  #1 x prn   Entered and Authorized by:   Waymon Budge MD   Signed by:   Waymon Budge MD on 01/20/2010   Method used:   Print then Give to Patient   RxID:   [id number]   Appended Document: CPAP autotitration download. order given to [facility] for cpap to be set either on auto or fixed pressure of 11, if machine can't do auto.

## 2010-08-18 NOTE — Medication Information (Signed)
Summary: Prior Authorization request for Androgel  Prior Authorization request for Androgel   Imported By: Maryln Gottron 07/20/2009 15:08:32  _____________________________________________________________________  External Attachment:    Type:   Image     Comment:   External Document

## 2010-08-18 NOTE — Assessment & Plan Note (Signed)
Summary: rov 6 months///kp   Copy to:  Dr Amador Cunas Primary Provider/Referring Provider:  [name]  MD  CC:  6 month follow up visit-sleep; using CPAP each night. and Headache.  History of Present Illness: January 04, 2010- Hemoptysis/ pneumonia, ? allergy, cough, AF/ warfarin, OSA Newly dx'd AF- warfarin by Dr Deborah Chalk. CBC w/o leukocytosis. No further hemoptysis despite coumadin. Feels weaker and easier dyspneic with exertion. He asks if this is related to his OSA. We also discussed his heart status. He has stopped the decongestant and is taking the plain allegra only. We agreed to wait on allergy testing. Coughing occ trace brown. Dr Kirtland Bouchard gave Zpak- completed. CXR reviewed fronm 2 weeks ago- ? RLL pneumonia or possibly parenchymal blood. 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 IgE 15.9, no specific elevations on panmel.  January 25, 2010- Hemoptysis/ pneumonia, ? allergy, Cough, AF/ warfarin, OSA Feels well now, with no cough. He feels back to normal. He remains in AF and is going back to Dr Deborah Chalk tomorrow. There has been no more hemoptysis. His original question to me about possible allergy is on hold unless it bothers him. He likes CPAP auto at 5-15 and is compliant with this. We reviewed PFT showing mild obstruction only in small airways.  CT- minor residual effusion, but no active pneumonia. He is almost completely cleared.  July 26, 2010- Hemoptysis/ pneumonia, ? allergy, Cough, AF/ warfarin, OSA Nurse-CC: 6 month follow up visit-sleep; using CPAP each night. No recurrence of bloody sputum. He notes much less cough and discomfort this year since he has been using Allegra-D. He thinks Zyrtec D had been associated with more cough. CT reviewed from June when he had RLL consolidation.  CPAP autoPAP- very successfull, used all night, every night, sleeping better.        Preventive Screening-Counseling &  Management  Alcohol-Tobacco     Smoking Status: quit > 6 months     Packs/Day: 1.0     Year Quit: 1980     Pack years: 20  Current Medications (verified): 1)  Xanax 1 Mg  Tabs (Alprazolam) .... Once Daily 2)  Adult Aspirin Low Strength 81 Mg  Tbdp (Aspirin) .... Once Daily 3)  Allopurinol 300 Mg  Tabs (Allopurinol) .... Once Daily 4)  Omeprazole 40 Mg  Cpdr (Omeprazole) .Marland Kitchen.. 1 By Mouth Daily 5)  Niaspan 500 Mg Cr-Tabs (Niacin (Antihyperlipidemic)) .... 2  By Mouth Daily 6)  Fish Oil 1000 Mg Caps (Omega-3 Fatty Acids) .... Bid 7)  Vitamin C 500 Mg Tabs (Ascorbic Acid) .... Qd 8)  Citalopram Hydrobromide 40 Mg Tabs (Citalopram Hydrobromide) .... One Daily 9)  Fexofenadine Hcl 180 Mg Tabs (Fexofenadine Hcl) .... One Daily 10)  Warfarin Sodium 5 Mg Tabs (Warfarin Sodium) .Marland Kitchen.. 1 To 1-1/2 Tabs By Mouth Once Daily 11)  Cpap Machine Autopap 5-15, Humidifier .... Intolerant of Fixed Cpap 12)  Cardizem Cd 240 Mg Xr24h-Cap (Diltiazem Hcl Coated Beads) .Marland Kitchen.. 1 Once Daily 13)  Crestor 10 Mg Tabs (Rosuvastatin Calcium) .... Qd 14)  Amoxicillin 500 Mg Caps (Amoxicillin) .... Prior To Dental Procedures 15)  Colcrys 0.6 Mg Tabs (Colchicine) .Marland Kitchen.. 1 By Mouth Qid As Needed Gout Pain 16)  Benicar 40 Mg Tabs (Olmesartan Medoxomil) .... Qd 17)  Furosemide 20 Mg Tabs (Furosemide) .Marland Kitchen.. 1 By Mouth Mon - Wed- Fri 18)  B Complex Formula 1  Tabs (Vitamins-Lipotropics) .... Take 1 By Mouth Once Daily  Allergies (verified): No  Known Drug Allergies  Past History:  Past Surgical History: Last updated: 12/20/2009 Appendectomy Septoplasty 1982 Uvulopharyngoplasty Vasectomy 1976  colonoscopy 2007,2008, 2010  Stress test in December 2009  Family History: Last updated: 05-08-2010 Father- died CVA age 54, Mouth ca, COPD Mother-   h/o breast Ca, heart dis  one brother- obesity, LBP one sister  Social History: Last updated: 12/20/2009 Married Smoked 1 PPD x 20 yrs, quit 1980's Regular  exercise-no Business owner- renovates churches, administrative, much travel ETOH- 2-4 drinks almost daily  Risk Factors: Exercise: no (10/08/2008)  Risk Factors: Smoking Status: quit > 6 months (07/26/2010) Packs/Day: 1.0 (07/26/2010)  Past Medical History: GERD (ICD-530.81) RESTLESS LEG SYNDROME (ICD-333.94) DIVERTICULOSIS, COLON (ICD-562.10) GASTRITIS (ICD-535.50) TUBULOVILLOUS ADENOMA, COLON (ICD-211.3) GASTROINTESTINAL HEMORRHAGE, HX OF (ICD-V12.79)-duodenal ulcer with hemorrhage in 1983 ENDOCARDITIS (ICD-424.90) 2002 MITRAL VALVE PROLAPSE (ICD-424.0) GOUT (ICD-274.9) ANXIETY (ICD-300.00) DEPRESSION (ICD-311) SLEEP APNEA (ICD-780.57) lumbar disk disease ([place]) ALLERGY (ICD-995.3) ALLERGIC Rhinitis HYPERCHOLESTEROLEMIA (ICD-272.0) HYPERTENSION (ICD-401.9) Atrial fibrillation-onset December 30, 2009 cardioversion 2011 impaired glucose tolerance  Review of Systems      See HPI  The patient denies shortness of breath with activity, shortness of breath at rest, productive cough, non-productive cough, coughing up blood, chest pain, irregular heartbeats, acid heartburn, indigestion, loss of appetite, weight change, abdominal pain, difficulty swallowing, sore throat, tooth/dental problems, headaches, nasal congestion/difficulty breathing through nose, and sneezing.    Vital Signs:  Patient profile:   65 year old male Height:      73 inches Weight:      245.50 pounds BMI:     32.51 O2 Sat:      96 % on Room air Pulse rate:   84 / minute BP sitting:   116 / 66  (right arm) Cuff size:   large  Vitals Entered By: Reynaldo Minium CMA (July 26, 2010 9:32 AM)  O2 Flow:  Room air CC: 6 month follow up visit-sleep; using CPAP each night., Headache   Physical Exam  Additional Exam:  General: A/Ox3; pleasant and cooperative, NAD, SKIN: no rash, lesions NODES: no lymphadenopathy HEENT: [place]/AT, EOM- WNL, Conjuctivae- injected, PERRLA, TM-WNL, Nose-mucus bridging, Throat-  clear and wnl, Mallampati  III NECK: Supple w/ fair ROM, JVD- none, normal carotid impulses w/o bruits Thyroid- normal to palpation CHEST: Clear now to P&A HEART: irregular, no m/g/r heard ABDOMEN: Soft and nl;  ZOX:WRUE, nl pulses, no edema  NEURO: Grossly intact to observation      Impression & Recommendations:  Problem # 1:  HEMOPTYSIS UNSPECIFIED (ICD-786.30)  Inactive following a penumonia. This is probably no longer a concern, but we will update CXR to get a baseline after last imaging which was a CXR done 7 months ago. Hemoptysis component likely related to the warfarin treating his AFib.  His updated medication list for this problem includes:    Vitamin C 500 Mg Tabs (Ascorbic acid) ..... Qd    Amoxicillin 500 Mg Caps (Amoxicillin) .Marland Kitchen... Prior to dental procedures    B Complex Formula 1 Tabs (Vitamins-lipotropics) .Marland Kitchen... Take 1 by mouth once daily  Problem # 2:  OBSTRUCTIVE SLEEP APNEA (ICD-327.23)  Good compliance and control. He does best with autoPAP rather than fixed CPAP.   Medications Added to Medication List This Visit: 1)  B Complex Formula 1 Tabs (Vitamins-lipotropics) .... Take 1 by mouth once daily  Other Orders: Est. Patient Level IV (45409) T-2 View CXR (71020TC)  Patient Instructions: 1)  Please schedule a follow-up appointment in 1 year. 2)  Continue CPAP 3)  A chest x-ray has been recommended.  Your imaging study may require preauthorization.

## 2010-08-18 NOTE — Progress Notes (Signed)
Summary: ? endocardiitis?  Phone Note Call from Patient   Caller: Patient Call For: [name]  MD Summary of Call: Pt went to see the Pulmonology, and was diagnosed with densities bilaterally.  Has had Endocarditis and is concerned he may have this again. Still complaining of SOB, coughing and weakness.  Wants to know if he can have tests endocarditis. [phone] Initial call taken by: Lynann Beaver CMA,  December 27, 2009 1:09 PM  Follow-up for Phone Call        OK to come by for set of blood cultures  today with  CBC;  ROV Thursday Follow-up by: [name]  MD,  December 27, 2009 1:52 PM  Additional Follow-up for Phone Call Additional follow up Details #1::        Pt. notified and will come for labs today, and come for office visit Thursday. Additional Follow-up by: Lynann Beaver CMA,  December 27, 2009 2:15 PM

## 2010-08-18 NOTE — Progress Notes (Signed)
Summary: REQ FOR MEDICATION RX  Phone Note Call from Patient   Caller:  Patient   [phone] Reason for Call: Talk to Nurse, Talk to Doctor Summary of Call: Pt called in to req that a Rx for med: Tessalon perles be sent into [facility], [place]...Marland KitchenMarland Kitchen Pt adv that the Hydromet syrup is not working well for him.... Pt can be reached at [phone] with any questions or concerns.  Triage not available.....Marland KitchenMarland KitchenPt aware that Dr Kirtland Bouchard is out of the office today.  Initial call taken by: Debbra Riding,  Nov 26, 2009 2:59 PM  Follow-up for Phone Call        generic tessalon perles 200 mg  #30 one three times a day as needed- please call and confirm still needed Follow-up by: [name]  MD,  Nov 29, 2009 8:02 AM  Additional Follow-up for Phone Call Additional follow up Details #1::        Contacted pt and he adv that he is still exp cough and would like to have Rx for tessalon perles be sent to [facility] [place].  Additional Follow-up by: Debbra Riding,  Nov 29, 2009 9:38 AM    Additional Follow-up for Phone Call Additional follow up Details #2::    spoke with pt - still with cough - per Dr. Amador Cunas - ok'd tessalon to be called out to [facility]. KIK Follow-up by: Duard Brady LPN,  Nov 29, 2009 12:20 PM  New/Updated Medications: TESSALON 200 MG CAPS (BENZONATATE) 1 by mouth three times a day prn Prescriptions: TESSALON 200 MG CAPS (BENZONATATE) 1 by mouth three times a day prn  #30 x 0   Entered by:   Duard Brady LPN   Authorized by:   [name]  MD   Signed by:   Duard Brady LPN on 84/16/6063   Method used:   Faxed to ...       [facility]. [phone]* (retail)       [phone] [address]       [place], [place]  [address]       Ph: [phone]       Fax: [phone]   RxID:   [id number]

## 2010-08-18 NOTE — Assessment & Plan Note (Signed)
Summary: sore throat -chest congestion-some fever//ccm   Vital Signs:  Patient profile:   65 year old male Weight:      243 pounds Temp:     98.7 degrees F oral BP sitting:   130 / 70  (left arm) Cuff size:   large  Vitals Entered By: Duard Brady LPN (Nov 23, 2009 9:27 AM) CC: c/o productive cough, waking , increased sweating in AM after taking meds Is Patient Diabetic? No   CC:  c/o productive cough, waking , and increased sweating in AM after taking meds.  History of Present Illness: 65 year old patient who has developed pain in chest congestion and cough.  This began one day after playing golf and then doing yard work.  He has had seasonal  similar problems over the years.  He has treated hypertension, dyslipidemia, and depression.  Denies any fever or purulent productive cough.  No chest pain or shortness of breath  Allergies (verified): No Known Drug Allergies  Past History:  Past Medical History: Reviewed history from 04/05/2009 and no changes required. Current Problems:  GERD (ICD-530.81) RESTLESS LEG SYNDROME (ICD-333.94) DIVERTICULOSIS, COLON (ICD-562.10) GASTRITIS (ICD-535.50) TUBULOVILLOUS ADENOMA, COLON (ICD-211.3) GASTROINTESTINAL HEMORRHAGE, HX OF (ICD-V12.79)-duodenal ulcer with hemorrhage in 1983 ENDOCARDITIS (ICD-424.90) 2002 MITRAL VALVE PROLAPSE (ICD-424.0) GOUT (ICD-274.9) ANXIETY (ICD-300.00) DEPRESSION (ICD-311) SLEEP APNEA (ICD-780.57) lumbar disk disease ([place])     ALLERGY (ICD-995.3) HYPERCHOLESTEROLEMIA (ICD-272.0) HYPERTENSION (ICD-401.9)  Review of Systems       The patient complains of prolonged cough.  The patient denies anorexia, fever, weight loss, weight gain, vision loss, decreased hearing, hoarseness, chest pain, syncope, dyspnea on exertion, peripheral edema, headaches, hemoptysis, abdominal pain, melena, hematochezia, severe indigestion/heartburn, hematuria, incontinence, genital sores, muscle weakness, suspicious skin  lesions, transient blindness, difficulty walking, depression, unusual weight change, abnormal bleeding, enlarged lymph nodes, angioedema, breast masses, and testicular masses.    Physical Exam  General:  overweight-appearing.  normal blood pressureoverweight-appearing.   Head:  Normocephalic and atraumatic without obvious abnormalities. No apparent alopecia or balding. Eyes:  No corneal or conjunctival inflammation noted. EOMI. Perrla. Funduscopic exam benign, without hemorrhages, exudates or papilledema. Vision grossly normal. Ears:  External ear exam shows no significant lesions or deformities.  Otoscopic examination reveals clear canals, tympanic membranes are intact bilaterally without bulging, retraction, inflammation or discharge. Hearing is grossly normal bilaterally. Mouth:  Oral mucosa and oropharynx without lesions or exudates.  Teeth in good repair. Neck:  No deformities, masses, or tenderness noted. Lungs:  Normal respiratory effort, chest expands symmetrically. Lungs are clear to auscultation, no crackles or wheezes. Heart:  Normal rate and regular rhythm. S1 and S2 normal without gallop, murmur, click, rub or other extra sounds.   Impression & Recommendations:  Problem # 1:  ALLERGY (ICD-995.3)  Problem # 2:  HYPERTENSION (ICD-401.9)  The following medications were removed from the medication list:    Benicar 40 Mg Tabs (Olmesartan medoxomil) .Marland Kitchen... 1 once daily    Benazepril Hcl 40 Mg Tabs (Benazepril hcl) ..... One daily His updated medication list for this problem includes:    Benazepril Hcl 40 Mg Tabs (Benazepril hcl) ..... One daily    The following medications were removed from the medication list:    Benicar 40 Mg Tabs (Olmesartan medoxomil) .Marland Kitchen... 1 once daily    Benazepril Hcl 40 Mg Tabs (Benazepril hcl) ..... One daily His updated medication list for this problem includes:    Benazepril Hcl 40 Mg Tabs (Benazepril hcl) ..... One daily  Complete Medication  List: 1)  Xanax 1 Mg Tabs (Alprazolam) .... Once daily 2)  Zyrtec Allergy 10 Mg Tabs (Cetirizine hcl) .... As needed 3)  Adult Aspirin Low Strength 81 Mg Tbdp (Aspirin) .... Once daily 4)  Allopurinol 300 Mg Tabs (Allopurinol) .... Once daily 5)  Colchicine 0.6 Mg Tabs (Colchicine) .... One tablet four times daily until gout resolves. 6)  Omeprazole 40 Mg Cpdr (Omeprazole) .Marland Kitchen.. 1 by mouth daily 7)  Requip 0.5 Mg Tabs (Ropinirole hcl) .... One by mouth at bedtime 8)  Niaspan 500 Mg Cr-tabs (Niacin (antihyperlipidemic)) .Marland Kitchen.. 1 by mouth daily 9)  Hydrocodone-homatropine 5-1.5 Mg/37ml Syrp (Hydrocodone-homatropine) .Marland Kitchen.. 1 teaspoon every 6 hours as needed for cough 10)  Fish Oil 1000 Mg Caps (Omega-3 fatty acids) .... Bid 11)  Vitamin C 500 Mg Tabs (Ascorbic acid) .... Qd 12)  Citalopram Hydrobromide 40 Mg Tabs (Citalopram hydrobromide) .... One daily 13)  Fexofenadine Hcl 180 Mg Tabs (Fexofenadine hcl) .... One daily 14)  Simvastatin 40 Mg Tabs (Simvastatin) .... One daily 15)  Benazepril Hcl 40 Mg Tabs (Benazepril hcl) .... One daily 16)  Prednisone 20 Mg Tabs (Prednisone) .... One twice daily for 7 days, then one daily for 7 days 17)  Fexofenadine-pseudoephedrine 60-120 Mg Xr12h-tab (Fexofenadine-pseudoephedrine) .... One twice daily as needed  Other Orders: Prescription Created Electronically [id number])  Patient Instructions: 1)  Please schedule a follow-up appointment in 6 months. 2)  Limit your Sodium (Salt) to less than 2 grams a day(slightly less than 1/2 a teaspoon) to prevent fluid retention, swelling, or worsening of symptoms. 3)  It is important that you exercise regularly at least 20 minutes 5 times a week. If you develop chest pain, have severe difficulty breathing, or feel very tired , stop exercising immediately and seek medical attention. Prescriptions: FEXOFENADINE-PSEUDOEPHEDRINE 60-120 MG XR12H-TAB (FEXOFENADINE-PSEUDOEPHEDRINE) one twice daily as needed  #90 x 4   Entered and  Authorized by:   [name]  MD   Signed by:   [name]  MD on 11/23/2009   Method used:   Print then Give to Patient   RxID:   [id number] FEXOFENADINE-PSEUDOEPHEDRINE 60-120 MG XR12H-TAB (FEXOFENADINE-PSEUDOEPHEDRINE) one twice daily as needed  #90 x 4   Entered and Authorized by:   [name]  MD   Signed by:   [name]  MD on 11/23/2009   Method used:   Print then Give to Patient   RxID:   [id number] HYDROCODONE-HOMATROPINE 5-1.5 MG/5ML SYRP (HYDROCODONE-HOMATROPINE) 1 teaspoon every 6 hours as needed for cough  #6 oz x 2   Entered and Authorized by:   [name]  MD   Signed by:   [name]  MD on 11/23/2009   Method used:   Print then Give to Patient   RxID:   [id number] Prudy Feeler 1 MG  TABS (ALPRAZOLAM) once daily  #90 x 4   Entered and Authorized by:   [name]  MD   Signed by:   [name]  MD on 11/23/2009   Method used:   Print then Give to Patient   RxID:   [id number] PREDNISONE 20 MG TABS (PREDNISONE) one twice daily for 7 days, then one daily for 7 days  #21 x 0   Entered and Authorized by:   [name]  MD   Signed by:   [name]  MD on 11/23/2009   Method used:   Electronically to        [facility]  [address] [phone]* (retail)       [phone] [facility]       [place], [place]  [address]       Ph: [phone]       Fax: [phone]   RxID:   [id number] BENAZEPRIL HCL 40 MG TABS (BENAZEPRIL HCL) one daily  #90 x 6   Entered and Authorized by:   [name]  MD   Signed by:   [name]  MD on 11/23/2009   Method used:   Electronically to        [facility]. [phone]* (retail)       [phone] [address]       [place], [place]  [address]       Ph: [phone]       Fax: [phone]   RxID:   [id number] SIMVASTATIN 40 MG TABS (SIMVASTATIN) one daily  #90 x 6   Entered  and Authorized by:   [name]  MD   Signed by:   [name]  MD on 11/23/2009   Method used:   Electronically to        [facility]. [phone]* (retail)       [phone]       [place], [place]  [address]       Ph: [phone]       Fax: [phone]   RxID:   [id number] FEXOFENADINE HCL 180 MG TABS (FEXOFENADINE HCL) one daily  #90 x 6   Entered and Authorized by:   [name]  MD   Signed by:   [name]  MD on 11/23/2009   Method used:   Electronically to        [facility]. [phone]* (retail)       [phone] [address].       [place], [place]  [address]       Ph: [phone]       Fax: [phone]   RxID:   [id number] CITALOPRAM HYDROBROMIDE 40 MG TABS (CITALOPRAM HYDROBROMIDE) one daily  #90 x 6   Entered and Authorized by:   [name]  MD   Signed by:   [name]  MD on 11/23/2009   Method used:   Electronically to        [facility]. [phone]* (retail)       [phone] [address].       [place], [place]  [address]       Ph: [phone]       Fax: [phone]   RxID:   [id number] NIASPAN 500 MG CR-TABS (NIACIN (ANTIHYPERLIPIDEMIC)) 1 by mouth daily  #180 x 6   Entered and Authorized by:   [name]  MD   Signed by:   [name]  MD on 11/23/2009   Method used:   Electronically to        [facility]. [phone]* (retail)       [phone] [address].       [place], [place]  [address]       Ph: [phone]       Fax: [phone]   RxID:  [id number] REQUIP 0.5 MG  TABS (ROPINIROLE HCL) one by mouth at bedtime  #90 x 6   Entered and Authorized by:   [name]  MD   Signed by:   [name]  MD on 11/23/2009   Method used:   Electronically to        [facility]. [phone]* (retail)       [phone].        [place], [place]  [address]       Ph: [phone]       Fax: [phone]   RxID:   [id number] OMEPRAZOLE 40 MG  CPDR (OMEPRAZOLE) 1 by mouth daily  #90 x 6   Entered and Authorized by:   [name]  MD   Signed by:   [name]  MD on 11/23/2009   Method used:   Electronically to        [facility]. [phone]* (retail)       [phone] [address].       [place], [place]  [address]       Ph: [phone]       Fax: [phone]   RxID:   [id number] ALLOPURINOL 300 MG  TABS (ALLOPURINOL) once daily  #90 x 6   Entered and Authorized by:   [name]  MD   Signed by:   [name]  MD on 11/23/2009   Method used:   Electronically to        [facility]. [phone]* (retail)       [phone] [address].       [place], [place]  [address]       Ph: [phone]       Fax: [phone]   RxID:   [id number]

## 2010-08-18 NOTE — Miscellaneous (Signed)
Summary: Orders Update pft charges  Clinical Lists Changes  Orders: Added new Service order of Carbon Monoxide diffusing w/capacity (94720) - Signed Added new Service order of Lung Volumes (94240) - Signed Added new Service order of Spirometry (Pre & Post) (94060) - Signed 

## 2010-08-18 NOTE — Progress Notes (Signed)
Summary: xray problems  Phone Note Call from Patient Call back at Home Phone [phone]   Caller: Patient Call For: Sharri Loya Summary of Call: calling about his xray still not any better Initial call taken by: Lacinda Axon,  December 27, 2009 12:46 PM  Follow-up for Phone Call        Pt states he has been thinking over the weekend about his CXR results and wanted to discuss some things with CY. he has a history of endocarditis and thought this may be contributing to his breahting problems. He also states that he was on an rx for prednisone and has had 2 cortisone injections in his back as well and states that he is aware that these meds can lower his immunity so pt is concerned about infection in his lungs.  He states his breathing has been worse to the point where he had to stop playing golf at 9 hoes when he usually can make 18. he states he discussed this with his PMD Dr. Kirtland Bouchard and he ordered blood cultures for the pt. and an ROV on Thursday. Pt wanted to know CY thoughts on this and if he agrees withthis plan. Pt has appt with CY on 01-04-10. Carron Curie CMA  December 27, 2009 3:11 PM   Additional Follow-up for Phone Call Additional follow up Details #1::        I called back. We discussed his concerns about persitent cough, dyspnea. He has appt for f/u after cultures  with Dr Kirtland Bouchard. He has an appointment with his cardologist Dt Deborah Chalk next Wednesday and I suggested discussion of echocardiogram. Infiltrate on CXR, dyspnea and heme might come from valvular heart disease. i explained that Allergy Profile looks benign and should be lower priority than his hemoptysis, dyspnea and infiltrate. Additional Follow-up by: Waymon Budge MD,  December 27, 2009 5:59 PM

## 2010-08-18 NOTE — Progress Notes (Signed)
Summary: req earlier appt w/ cy  Phone Note Call from Patient Call back at Home Phone [phone]   Caller: Patient Call For: Arless Vineyard Summary of Call: pt has a consult sched'd w/ cy for 6/21- allergies per dr Lesia Hausen. however, today pt has called as he has been coughing (also due to allergies per pt) up blood for the last 2-3 days. pt states this is "not with every cough" but fairly often- small amounts- smaller than a quarter and "red-not pink" in color. wants to know if he could be seen sooner by dr Krista Som.  Initial call taken by: Tivis Ringer, CNA,  December 16, 2009 1:03 PM  Follow-up for Phone Call        Spoke with pt and he states he was referred to [facility] for allergy consult, becasue he has been having a persistent couogh for several months. He staets x the past 2-3 days he has been coughing up a small amount of red blood several times a day. he states the amount is smaller then a quarter and does not happen everytime he coughs. he denies any blood from his nose. He states he has an extensive allergy history and has seen Dr. Haroldine Laws in the past as a [facility] adult. Pt has seen Dr. Craige Cotta in the past for sleep apnea.  Spoke with Katie and TD and they ok to add pt on tomorrow at 4:15.  When I called to advise pt he then told me he is at the beach and will not be back until Sunday afternoon so he asks for an appt for Monday. I spoke to KW again and she oked to add pt on at 11:30 on 12-20-09. Pt advised. Carron Curie CMA  December 16, 2009 2:06 PM

## 2010-08-18 NOTE — Letter (Signed)
Summary: CMN/[facility]  CMN/[facility]   Imported By: Lester Lakehead 02/16/2010 09:52:12  _____________________________________________________________________  External Attachment:    Type:   Image     Comment:   External Document

## 2010-09-01 ENCOUNTER — Other Ambulatory Visit (INDEPENDENT_AMBULATORY_CARE_PROVIDER_SITE_OTHER): Payer: No Typology Code available for payment source

## 2010-09-01 ENCOUNTER — Other Ambulatory Visit: Payer: No Typology Code available for payment source

## 2010-09-01 ENCOUNTER — Encounter: Payer: Self-pay | Admitting: Internal Medicine

## 2010-09-01 ENCOUNTER — Ambulatory Visit (INDEPENDENT_AMBULATORY_CARE_PROVIDER_SITE_OTHER): Payer: No Typology Code available for payment source | Admitting: Internal Medicine

## 2010-09-01 DIAGNOSIS — F3289 Other specified depressive episodes: Secondary | ICD-10-CM

## 2010-09-01 DIAGNOSIS — I10 Essential (primary) hypertension: Secondary | ICD-10-CM

## 2010-09-01 DIAGNOSIS — I4891 Unspecified atrial fibrillation: Secondary | ICD-10-CM

## 2010-09-01 DIAGNOSIS — E7800 Pure hypercholesterolemia, unspecified: Secondary | ICD-10-CM

## 2010-09-01 DIAGNOSIS — F329 Major depressive disorder, single episode, unspecified: Secondary | ICD-10-CM

## 2010-09-01 DIAGNOSIS — Z7901 Long term (current) use of anticoagulants: Secondary | ICD-10-CM

## 2010-09-01 DIAGNOSIS — R7309 Other abnormal glucose: Secondary | ICD-10-CM

## 2010-09-01 DIAGNOSIS — R7302 Impaired glucose tolerance (oral): Secondary | ICD-10-CM

## 2010-09-01 HISTORY — DX: Impaired glucose tolerance (oral): R73.02

## 2010-09-01 MED ORDER — ATORVASTATIN CALCIUM 20 MG PO TABS: 20.0000 mg | ORAL_TABLET | Freq: Every day | ORAL | Status: DC

## 2010-09-01 MED ORDER — CITALOPRAM HYDROBROMIDE 40 MG PO TABS: 40.0000 mg | ORAL_TABLET | Freq: Every day | ORAL | Status: DC

## 2010-09-01 NOTE — Patient Instructions (Signed)
Limit your sodium (Salt) intake    It is important that you exercise regularly, at least 20 minutes 3 to 4 times per week.  If you develop chest pain or shortness of breath seek  medical attention.  You need to lose weight.  Consider a lower calorie diet and regular exercise.  Return in 4 months for follow-up 

## 2010-09-01 NOTE — Progress Notes (Signed)
  Subjective:    Patient ID: [name], male    DOB: [birth year], 65 y.o.   MRN: [id number]  HPI  65 year old patient who is seen today for follow-up.  He is far closely by cardiology for chronic atrial Fibrillation and has been stable since a cardioversion.  He remains on chronic Coumadin anticoagulation.  Denies any palpitations He has a history of glucose intolerance and has been walking daily and has lost 15 pounds in weight.  A random blood sugar today 109 He has a history of hypertension, well-controlled on Benicar, as well as diltiazem. He has a history of depression, doing well on Celexa 40 mg daily    Review of Systems  Constitutional: Negative for fever, chills, appetite change and fatigue.  HENT: Negative for hearing loss, ear pain, congestion, sore throat, trouble swallowing, neck stiffness, dental problem, voice change and tinnitus.   Eyes: Negative for pain, discharge and visual disturbance.  Respiratory: Negative for cough, chest tightness, wheezing and stridor.   Cardiovascular: Negative for chest pain, palpitations and leg swelling.  Gastrointestinal: Negative for nausea, vomiting, abdominal pain, diarrhea, constipation, blood in stool and abdominal distention.  Genitourinary: Negative for urgency, hematuria, flank pain, discharge, difficulty urinating and genital sores.  Musculoskeletal: Negative for myalgias, back pain, joint swelling, arthralgias and gait problem.  Skin: Negative for rash.  Neurological: Negative for dizziness, syncope, speech difficulty, weakness, numbness and headaches.  Hematological: Negative for adenopathy. Does not bruise/bleed easily.  Psychiatric/Behavioral: Negative for behavioral problems and dysphoric mood. The patient is not nervous/anxious.        Objective:   Physical Exam  Constitutional: He is oriented to person, place, and time. He appears well-developed and well-nourished.       overweight  HENT:  Head: Normocephalic.  Right  Ear: External ear normal.  Left Ear: External ear normal.  Eyes: Conjunctivae and EOM are normal.  Neck: Normal range of motion.  Cardiovascular: Normal rate and normal heart sounds.   Pulmonary/Chest: Breath sounds normal.  Abdominal: Bowel sounds are normal.  Musculoskeletal: Normal range of motion. He exhibits no edema and no tenderness.  Neurological: He is alert and oriented to person, place, and time.  Psychiatric: He has a normal mood and affect. His behavior is normal.          Assessment & Plan:  Hypertension stable Atrial fibrillation- stable Impaired glucose tolerance Depression stable

## 2010-09-12 ENCOUNTER — Other Ambulatory Visit: Payer: Self-pay | Admitting: Surgery

## 2010-09-14 ENCOUNTER — Encounter: Payer: Self-pay | Admitting: Internal Medicine

## 2010-09-14 ENCOUNTER — Ambulatory Visit (INDEPENDENT_AMBULATORY_CARE_PROVIDER_SITE_OTHER): Payer: No Typology Code available for payment source | Admitting: Internal Medicine

## 2010-09-14 DIAGNOSIS — J069 Acute upper respiratory infection, unspecified: Secondary | ICD-10-CM

## 2010-09-14 DIAGNOSIS — I4891 Unspecified atrial fibrillation: Secondary | ICD-10-CM

## 2010-09-14 DIAGNOSIS — J309 Allergic rhinitis, unspecified: Secondary | ICD-10-CM

## 2010-09-14 DIAGNOSIS — I10 Essential (primary) hypertension: Secondary | ICD-10-CM

## 2010-09-14 MED ORDER — HYDROCODONE-HOMATROPINE 5-1.5 MG/5ML PO SYRP: 5.0000 mL | ORAL_SOLUTION | Freq: Four times a day (QID) | ORAL | Status: AC | PRN

## 2010-09-14 MED ORDER — FEXOFENADINE HCL 180 MG PO TABS: 180.0000 mg | ORAL_TABLET | Freq: Every day | ORAL | Status: DC

## 2010-09-14 NOTE — Progress Notes (Signed)
  Subjective:    Patient ID: [name], male    DOB: 07-25-1945, 65 y.o.   MRN: [id number]  HPI   65 year old patient has a history of hypertension and paroxysmal atrial fibrillation. Medical regimen includes chronic anticoagulation therapy. He also has a history of allergic rhinitis. For the past several days he has had head congestion and cough. There's been no fever purulent sinus drainage. He states that he has been using the Allegra D. Denies any tachycardia arrhythmias. There's been no fever. His wife has had a similar illness.    Review of Systems  Constitutional: Negative for fever, chills, appetite change and fatigue.  HENT: Positive for congestion, rhinorrhea and postnasal drip. Negative for hearing loss, ear pain, sore throat, trouble swallowing, neck stiffness, dental problem, voice change and tinnitus.   Eyes: Negative for pain, discharge and visual disturbance.  Respiratory: Positive for cough. Negative for chest tightness, wheezing and stridor.   Cardiovascular: Negative for chest pain, palpitations and leg swelling.  Gastrointestinal: Negative for nausea, vomiting, abdominal pain, diarrhea, constipation, blood in stool and abdominal distention.  Genitourinary: Negative for urgency, hematuria, flank pain, discharge, difficulty urinating and genital sores.  Musculoskeletal: Negative for myalgias, back pain, joint swelling, arthralgias and gait problem.  Skin: Negative for rash.  Neurological: Negative for dizziness, syncope, speech difficulty, weakness, numbness and headaches.  Hematological: Negative for adenopathy. Does not bruise/bleed easily.  Psychiatric/Behavioral: Negative for behavioral problems and dysphoric mood. The patient is not nervous/anxious.        Objective:   Physical Exam  Constitutional: He is oriented to person, place, and time. He appears well-developed and well-nourished. No distress.        Slightly hoarse  HENT:  Head: Normocephalic.  Right  Ear: External ear normal.  Left Ear: External ear normal.  Eyes: Conjunctivae and EOM are normal.  Neck: Normal range of motion.  Cardiovascular: Normal rate and normal heart sounds.   Pulmonary/Chest: Breath sounds normal.  Abdominal: Bowel sounds are normal.  Musculoskeletal: Normal range of motion. He exhibits no edema and no tenderness.  Neurological: He is alert and oriented to person, place, and time.  Psychiatric: He has a normal mood and affect. His behavior is normal.          Assessment & Plan:   URI. We'll treat symptomatically and the patient was given a prescription for Hydromet. He has been asked to discontinue decongestants and now is aware that this may aggravate his tachyarrhythmia.  Hypertension stable  Paroxysmal atrial  Fibrillation stable

## 2010-09-14 NOTE — Patient Instructions (Signed)
Get plenty of rest, Drink lots of  clear liquids, and use Tylenol or ibuprofen for fever and discomfort.    Call or return to clinic prn if these symptoms worsen or fail to improve as anticipated.  

## 2010-09-26 ENCOUNTER — Telehealth: Payer: Self-pay

## 2010-09-26 NOTE — Telephone Encounter (Signed)
VM from [facility] - at [facility] center - pt has been admitted and they need list of meds - pt can't recall all - call back - [phone]   Attempt to call - VM - informed that we need signed consent faxed to [place] - number given - I would fax med list to them [facility]

## 2010-09-29 ENCOUNTER — Other Ambulatory Visit: Payer: No Typology Code available for payment source

## 2010-09-30 ENCOUNTER — Encounter: Payer: Self-pay | Admitting: Internal Medicine

## 2010-09-30 ENCOUNTER — Ambulatory Visit (INDEPENDENT_AMBULATORY_CARE_PROVIDER_SITE_OTHER): Payer: No Typology Code available for payment source | Admitting: Internal Medicine

## 2010-09-30 ENCOUNTER — Other Ambulatory Visit (INDEPENDENT_AMBULATORY_CARE_PROVIDER_SITE_OTHER): Payer: No Typology Code available for payment source

## 2010-09-30 DIAGNOSIS — I38 Endocarditis, valve unspecified: Secondary | ICD-10-CM

## 2010-09-30 DIAGNOSIS — I4891 Unspecified atrial fibrillation: Secondary | ICD-10-CM

## 2010-09-30 DIAGNOSIS — I10 Essential (primary) hypertension: Secondary | ICD-10-CM

## 2010-09-30 DIAGNOSIS — N390 Urinary tract infection, site not specified: Secondary | ICD-10-CM

## 2010-09-30 DIAGNOSIS — A419 Sepsis, unspecified organism: Secondary | ICD-10-CM

## 2010-09-30 DIAGNOSIS — Z7901 Long term (current) use of anticoagulants: Secondary | ICD-10-CM

## 2010-09-30 LAB — PROTIME-INR
INR: 1.84 — ABNORMAL HIGH (ref 0.00–1.49)
Prothrombin Time: 21.4 seconds — ABNORMAL HIGH (ref 11.6–15.2)

## 2010-09-30 MED ORDER — TAMSULOSIN HCL 0.4 MG PO CAPS: 0.4000 mg | ORAL_CAPSULE | ORAL | Status: AC

## 2010-09-30 NOTE — Patient Instructions (Signed)
Return in 3 months for follow-up  discontinue Benicar  Please check your blood pressure on a regular basis.  If it is consistently greater than 150/90, please make an office appointment.

## 2010-09-30 NOTE — Progress Notes (Signed)
  Subjective:    Patient ID: [name], male    DOB: [birth year], 65 y.o.   MRN: [id number]  HPI  65 year old patient who was in [place] recently for the [facility] terminal. He became acutely ill with fever chills and pyuria. He was admitted to the hospital for 3 days for suspected pyelonephritis and is completing a course of Cipro at this time. He has a remote history of endocarditis and apparently blood cultures and 2-D echocardiogram normal. Today he feels well. He does have some mild BPH symptoms and he is interested in starting an alpha blocker to improve symptoms and perhaps minimize his chance of recurrent urosepsis   Review of Systems  Constitutional: Negative for fever, chills, appetite change and fatigue.  HENT: Negative for hearing loss, ear pain, congestion, sore throat, trouble swallowing, neck stiffness, dental problem, voice change and tinnitus.   Eyes: Negative for pain, discharge and visual disturbance.  Respiratory: Negative for cough, chest tightness, wheezing and stridor.   Cardiovascular: Negative for chest pain, palpitations and leg swelling.  Gastrointestinal: Negative for nausea, vomiting, abdominal pain, diarrhea, constipation, blood in stool and abdominal distention.  Genitourinary: Negative for urgency, hematuria, flank pain, discharge, difficulty urinating and genital sores.  Musculoskeletal: Negative for myalgias, back pain, joint swelling, arthralgias and gait problem.  Skin: Negative for rash.  Neurological: Negative for dizziness, syncope, speech difficulty, weakness, numbness and headaches.  Hematological: Negative for adenopathy. Does not bruise/bleed easily.  Psychiatric/Behavioral: Negative for behavioral problems and dysphoric mood. The patient is not nervous/anxious.        Objective:   Physical Exam  Constitutional: He is oriented to person, place, and time. He appears well-developed.  HENT:  Head: Normocephalic.  Right Ear: External ear normal.  Left  Ear: External ear normal.  Eyes: Conjunctivae and EOM are normal.  Neck: Normal range of motion.  Cardiovascular: Normal rate and normal heart sounds.   Pulmonary/Chest: Breath sounds normal.  Abdominal: Bowel sounds are normal.  Musculoskeletal: Normal range of motion. He exhibits no edema and no tenderness.  Neurological: He is alert and oriented to person, place, and time.  Psychiatric: He has a normal mood and affect. His behavior is normal.          Assessment & Plan:   status post urosepsis/ pyelonephritis. We'll continue Cipro antibiotic therapy  atrial fibrillation.   The patient will check with his cardiologist in the near future. May consider a trial off Coumadin anticoagulation since he has been in normal sinus rhythm for some time  Hypertension we'll discontinue Benicar and place on Flomax for mild BPH. We'll reassess in 3 months

## 2010-10-10 ENCOUNTER — Ambulatory Visit (INDEPENDENT_AMBULATORY_CARE_PROVIDER_SITE_OTHER): Payer: No Typology Code available for payment source | Admitting: *Deleted

## 2010-10-10 DIAGNOSIS — Z7901 Long term (current) use of anticoagulants: Secondary | ICD-10-CM

## 2010-10-10 DIAGNOSIS — I4891 Unspecified atrial fibrillation: Secondary | ICD-10-CM

## 2010-10-17 ENCOUNTER — Ambulatory Visit (INDEPENDENT_AMBULATORY_CARE_PROVIDER_SITE_OTHER): Payer: No Typology Code available for payment source | Admitting: *Deleted

## 2010-10-17 DIAGNOSIS — Z7901 Long term (current) use of anticoagulants: Secondary | ICD-10-CM

## 2010-10-17 DIAGNOSIS — I4891 Unspecified atrial fibrillation: Secondary | ICD-10-CM

## 2010-10-19 ENCOUNTER — Encounter: Payer: Self-pay | Admitting: Internal Medicine

## 2010-10-19 ENCOUNTER — Telehealth: Payer: Self-pay | Admitting: Cardiology

## 2010-10-19 NOTE — Telephone Encounter (Signed)
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

## 2010-10-20 ENCOUNTER — Encounter (INDEPENDENT_AMBULATORY_CARE_PROVIDER_SITE_OTHER): Payer: No Typology Code available for payment source | Admitting: Cardiology

## 2010-10-20 DIAGNOSIS — I4891 Unspecified atrial fibrillation: Secondary | ICD-10-CM

## 2010-10-20 DIAGNOSIS — IMO0002 Reserved for concepts with insufficient information to code with codable children: Secondary | ICD-10-CM

## 2010-10-24 NOTE — Progress Notes (Signed)
This encounter was created in error - please disregard.

## 2010-10-31 ENCOUNTER — Telehealth: Payer: Self-pay | Admitting: Cardiology

## 2010-10-31 ENCOUNTER — Other Ambulatory Visit: Payer: No Typology Code available for payment source | Admitting: *Deleted

## 2010-10-31 NOTE — Telephone Encounter (Signed)
Pt requesting Holter report called to him; please call when the results are back

## 2010-10-31 NOTE — Telephone Encounter (Signed)
PATIENT SAID HAD A TEST DONE AWHILE BACK AND NEVER HEARD ANYTHING. SEARCHING FOR CHART.

## 2010-11-01 ENCOUNTER — Telehealth: Payer: Self-pay | Admitting: *Deleted

## 2010-11-01 NOTE — Telephone Encounter (Signed)
Pt notified of holter monitor results and to continue to work on diet, weight loss, avoid alcohol, and exercise regularly.  Pt told to stop Coumadin per Dr. Ronnald Nian instructions and to start Aspirin 81 mg daily.

## 2010-11-08 ENCOUNTER — Telehealth: Payer: Self-pay | Admitting: *Deleted

## 2010-11-08 NOTE — Telephone Encounter (Signed)
RN had voicemail from pt, asking if he could stop Diltiazem and switch to Benicar.

## 2010-11-08 NOTE — Telephone Encounter (Signed)
[name], These are not interchangable.

## 2010-11-08 NOTE — Telephone Encounter (Signed)
RN left voicemail for pt that Benicar and Diltiazem are not interchangeable drugs.  Pt told to call back with any concerns.

## 2010-11-28 ENCOUNTER — Telehealth: Payer: Self-pay | Admitting: *Deleted

## 2010-11-28 NOTE — Telephone Encounter (Signed)
Called pt to follow up on PT;   Pt is no longer on Coumadin

## 2010-11-29 NOTE — Assessment & Plan Note (Signed)
[facility]                             PULMONARY OFFICE NOTE   CRAIGORY, TOSTE                     MRN:          [id number]  DATE:06/11/2007                            DOB:          18-Jul-1945    I saw Mr. Bossard in followup today for his sleep apnea and restless leg  syndrome.   With regard to his sleep apnea he seems to have been doing reasonably  well on his current setting of CPAP at 11 cm of water.  He does feel  that using a humidifier helps as well.  He is not having any problems as  far as using his mask.  He does, however, complain of having episodes of  reflux at night which cause him to wake up with a coughing spell.  He  says he takes Zantac at night, and this seems to help.  With regards to  his restless leg syndrome he had undergone laboratory tests which were  unremarkable.  He continues to have problems with his leg symptoms which  happened about 15-20 minutes after he goes to sleep.   IMPRESSION:  1. Obsessive sleep apnea.  He is to continue on CPAP at 11 cm of      water.  2. Restless leg syndrome.  I will start him on Requip 0.25 mg, and      have him gradually increase the dose until he either gains      symptomatic benefit or sustains side effects.  I have reviewed the      various side effects with him.  I have also discussed with him that      he needs to be careful with driving until he gets a good sense as      far as how he responds to the Requip.  In addition I have discussed      with him that some of his medications specifically his Lexapro      could be contributing to his symptoms of restless leg syndrome, and      he is to discuss this further with his primary care physician.  I      have also discussed with him that he should try to limit the      consumption of alcohol as this could be adversely affecting his leg      symptoms as well.  3. Persistent hypersomnia.  I advised him to try and switch the  timing      of the dose of his Zyrtec to see if this helps improve some of his      symptoms, although hopefully by improving his restless leg syndrome      this will help as well.  4. Symptoms of reflux.  He had been previously evaluated by Dr.      Claudette Head, and I have advised him to contact Dr. Russella Dar for      further assessment with regards to his reflux symptoms.   FOLLOWUP:  I will follow up with him in about 6-8 weeks.     Coralyn Helling, MD  Electronically  Signed    VS/MedQ  DD: 06/11/2007  DT: 06/11/2007  Job #: [id number]

## 2010-11-29 NOTE — Assessment & Plan Note (Signed)
[facility]                             PULMONARY OFFICE NOTE   GAGAN, DILLION                     MRN:          [id number]  DATE:04/24/2007                            DOB:          [birth year]    I admitted Mr. Busk today for evaluation of his sleep apnea.   He said that he had undergone an overnight polysomnogram approximately 8  years ago, and was diagnosed with sleep apnea.  He initially had a  uvulopharyngoplasty, which was unsuccessful.  He was then started on  CPAP therapy, and he has been on CPAP for the last 8 years.  He is  currently using nasal pillows.  He had tried using a humidifier, but he  said he travels quite a bit with his work, and it was too much of a  nuisance to continue using the humidifier.   His current sleep pattern is that he goes to bed between 9:30 and 10:30.  He falls asleep fairly quickly.  He, however, will wake up several times  during the night with a coughing sensation; and sometimes it is  difficult for him to fall back to sleep.  He then wakes up at 6:30 in  the morning.  He will sleep until about 7:30 on weekends.  He says that  when he was first started on CPAP he felt a significant improvement, but  he has not noticed as much of a benefit recently.  He says that he is  now feeling sleepy during the day, and he will fall asleep easily while  he is watching TV or reading.  He drinks 6 cups of coffee in the  morning.  He will get odd feelings in his legs, where he has to keep  rubbing his legs at night (about 4 or 5 nights a week).  He said he has  tried using a balm as well as Tylenol and Xanax to help with his leg  symptoms.  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  He said that he used to  have problems with nightmares, but this improved after he had CPAP  therapy.  There is no history of sleep walking or sleep talking.  He  denies any symptoms of sleep hallucinations, sleep paralysis or  cataplexy.   His Epworth score today is 13:24.   PAST MEDICAL HISTORY:  1. Hypertension.  2. Elevated cholesterol.  3. Allergies.  4. Sleep apnea.  5. Depression.  6. Anxiety.  7. Gout.  8. Mitral valve prolapse.  9. Endocarditis in 2002.  10.Repair of nasal septal deviation.  11.Uvuolopharyngoplasty.  12.Appendectomy.  13.Lower GI bleeding, secondary to colonoscopy in 2007.   CURRENT MEDICATIONS:  1. Lexapro 20 mg daily.  2. Xanax 1 mg daily.  3. Zyrtec as needed.  4. Zantac 75 mg daily.  5. Aspirin 81 mg daily.  6. Pravastatin 40 mg daily.  7. Allopurinol 300 mg daily.  8. Lisinopril 40 mg daily.  9. Colchicine 0.6 mg as needed.  10.Amoxycillin before dental procedures.   ALLERGIES:  HE HAS NO KNOWN DRUG ALLERGIES.   SOCIAL HISTORY:  He is married.  He has 2 children.  He works for [employer], and says that he travels quite a bit with his work.  He  quit smoking in 1980.  He has 3-4 drinks a day.   FAMILY HISTORY:  Significant for his father with heart disease and  emphysema.  His mother who had breast cancer.   REVIEW OF SYSTEMS:  He does complain of feeling depressed occasionally.   PHYSICAL EXAMINATION:  He is 6 feet 3 inches tall, 240 pounds.  Temperature 98.4, blood pressure 162/70, heart rate 94, oxygen  saturation 96% on room air.  HEENT:  Pupils reactive.  There is no sinus tenderness.  There is no  lesions.  He has changes from a uvulopharyngoplasty.  He has an overbite  and erosions over his frontal teeth.  NECK:  There is no lymphadenopathy, no thyromegaly.  HEART:  S1 and S2.  CHEST:  No wheezing or rales.  ABDOMEN:  Obese, soft, nontender.  EXTREMITIES:  There was no edema.  NEUROLOGIC:  No focal deficits were appreciated.   IMPRESSION:  1. OBSTRUCTIVE SLEEP APNEA.  I discussed with him the diagnosis of      sleep apnea, and the adverse health consequences related to sleep      apnea.  I also discussed with him the importance of limiting his       use of alcohol and sedatives, particularly around the time of      sleep.  Driving precautions were discussed with him as well.  I am      concerned that he may not be on the optimal pressure setting for      his CPAP machine.  I will therefore have him undergo an auto CPAP      titration study for 2 weeks, to determine if he needs to have any      adjustments in his pressure.  If he is still symptomatic after      this, he may need to be referred back to the Sleep Lab for in-lab      titration study.  At this time, at least, it does not appear that      he is having any difficulty with leaking from his mask; although,      this may need to be investigated further.  2. SYMPTOMS OF RESTLESS LEG SYNDROME.  I will have him undergo      laboratory assessment to check his ferritin level, as well as B12      and folate levels.  If these are low, he may benefit from      supplementation.  Otherwise, I would defer further assessment of      this      until after we stabilize his sleep apnea.  If he is still      symptomatic after that, then he may benefit from the use of a      dopamine agonist.   I will follow up with him in approximately 1 month.     Coralyn Helling, MD  Electronically Signed    VS/MedQ  DD: 04/24/2007  DT: 04/25/2007  Job #: [id number]

## 2010-11-29 NOTE — Assessment & Plan Note (Signed)
[facility]                         GASTROENTEROLOGY OFFICE NOTE   Dennis, BLASDELL                     MRN:          [id number]  DATE:06/26/2007                            DOB:          01-26-1946    [name] is referred back to see me by Dr. Craige Cotta for a night time  cough.  He relates a history of bleeding ulcer in the 1980s.  Prior  upper endoscopy by Dr. Victorino Dike in 1985 that showed mild gastritis  and moderately severe duodenitis.  He has sleep apnea and is maintained  on CPAP.  He has noted a nocturnal cough for about the last 2 months and  he has been treated with Zantac 150 mg a day for possible GERD.  he  notes occasional solid food dysphagia as well.  He notes no odynophagia,  nausea or vomiting, abdominal pain, change in bowel habits, melena, or  hematochezia.   CURRENT MEDICATIONS:  Listed on the chart, updated, and reviewed.   MEDICATION ALLERGIES/INTOLERANCE:  HIGHER DOSE ASPIRIN with dyspeptic  symptoms.   EXAM:  In no acute distress.  Weight 138.2 pounds, blood pressure 130/60, pulse 68 and regular.  CHEST:  Clear to auscultation bilaterally.  CARDIAC:  Regular rate and rhythm without murmurs appreciated.  ABDOMEN:  Soft, nontender, nondistended.  Normoactive bowel sounds.  No  palpable organomegaly, masses, or hernias.   ASSESSMENT AND PLAN:  1. Nocturnal and recumbent cough.  Dysphagia.  Rule out      gastroesophageal reflux disease.  Begin omeprazole 40 mg p.o. q.      a.m. taken 30 minutes before breakfast and discontinue ranitidine.      He is given instructions on all standard antireflux measures and is      advised to not take anything by mouth for 3 to 4 hours before      bedtime, and to begin the use of 4 inch bed blocks.  Risks,      benefits, and alternatives to upper endoscopy with possible biopsy      and possible dilation discussed with the patient.  He consents to      proceed.  This will be scheduled  electively.  2. Personal history of tubulovillous adenomatous colon polyps.  Re-      call colonoscopy recommended for July 2010.     Venita Lick. Russella Dar, MD, [facility]  Electronically Signed    MTS/MedQ  DD: 06/26/2007  DT: 06/27/2007  Job #: [id number]   cc:   Coralyn Helling, MD

## 2010-12-02 NOTE — Consult Note (Signed)
[place]  Patient:    BRUIN, BOLGER                     MRN: [id number] Proc. Date: 02/09/01 Adm. Date:  [id number] Attending:  Farley Ly CC:         Farley Ly, M.D.  Colleen Can. Deborah Chalk, M.D.  Ward Roxan Hockey, M.D.   Consultation Report  REASON FOR CONSULTATION:  Mitral valve endocarditis.  CHIEF COMPLAINT:  Fevers, chills, and sweats for three weeks.  HISTORY OF PRESENT ILLNESS:  Mr. Biggins is a 65 year old gentleman with a history of known mitral valve prolapse. This was diagnosed 10 years ago and has been asymptomatic. He has not had further diagnostic workup since then. However, he has used antibiotics whenever he has had any dental or invasive procedures. Approximately three weeks prior to admission, he started developing fevers and chills. He did not have any other symptoms. There was no diarrhea, constipation, urinary tract symptoms, nausea, or vomiting. He initially thought he had a virus. However, when the symptoms persisted, he had blood cultures done which showed positive group D Streptococcus, and repeat blood cultures were positive, as well. He then was admitted with probable bacterial endocarditis.  PAST SURGICAL HISTORY/PAST MEDICAL HISTORY:  Significant for mitral valve prolapse, diverticulitis, appendectomy two years ago, history of bleeding gastric ulcers 20 years ago.  MEDICATIONS: 1. Zantac 150 mg b.i.d. 2. Xanax 0.25 mg b.i.d. 3. Allegra 60 mg q.d.  ALLERGIES:  He has no known drug allergies.  FAMILY HISTORY:  Noncontributory.  SOCIAL HISTORY:  He is married for 33 years. He smoked one pack a day for 12 years and quit 20 years ago. 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 He occasionally uses ethanol.  REVIEW OF SYSTEMS:  He has had headaches but no neurologic symptoms, such as dizziness, visual changes, weakness. He has had no skin rash or sore throat. He has had no cough or sputum  production or shortness of breath. No paroxysmal nocturnal dyspnea, orthopnea, or edema. He has had no abdominal symptoms or urinary tract symptoms. He has no history of bleeding or bruising or clotting. All of his systems are negative.  PHYSICAL EXAMINATION:  GENERAL:  Mr. Sprung is a well-appearing, 65 year old male in no acute distress.  VITAL SIGNS:  His temperature is 98.5, blood pressure 131/77, pulse is 88, respirations are 20. He is in no distress.  NEUROLOGICAL:  He is alert and oriented x 3. Grossly intact. No focal deficits.  NECK:  Supple without thyromegaly, adenopathy, or bruits.  CARDIAC:  Regular rate and rhythm with a 2/6 systolic murmur at the apex with no rubs or gallops.  LUNGS:  Clear to auscultation and percussion.  ABDOMEN:  Benign.  SKIN:  Intact.  EXTREMITIES:  Without clubbing, cyanosis, or edema. He has 2+ pulses throughout. He has no ______ hemorrhages.  LABORATORY DATA:  On admission, his white count was 12.4, his hemoglobin was 12.4, platelet count was 316. His electrolytes were within normal limits. BUN and creatinine were 15 and 1. Glucose was 125. His total bilirubin was 1.3. ALT was 54, AST 28, alkaline phosphatase normal. Albumin was 3.4. Blood cultures were positive for group D Streptococcus.  Transthoracic echo results as noted in the HPI.  IMPRESSION:  Mr. Chavarin is a 65 year old gentleman with history of mitral valve prolapse which has been asymptomatic until this time. He has had appropriate antibiotic prophylaxis for invasive procedures. He now presents with  a three-week history of fevers and chills and multiple positive blood cultures consistent with endocarditis. He has no stigmata of peripheral embolization and no symptoms or signs of congestive heart failure at the present time. He has had a transthoracic echo which revealed moderate MR but no definite vegetations.  RECOMMENDATIONS:  I agree with IV antibiotics as recommended  by Burnice Logan, M.D. He is to have a transesophageal echo on Monday or Tuesday. I do not see any indication for doing that sooner at the present time as he is ______ and no evidence of peripheral embolization and no symptoms of congestive heart failure. Given that he is Strep bovis, he needs a GI workup, including colonoscopy as outlined by Dr. Roxan Hockey. There is no indication for surgery at the present time, but I will be happy to follow Mr. Bale as his course unfolds and we get further information from the transesophageal echo. DD:  02/09/01 TD:  02/09/01 Job: [id number]

## 2010-12-02 NOTE — Assessment & Plan Note (Signed)
[facility]                           GASTROENTEROLOGY OFFICE NOTE   [name], [name]                     MRN:          [id number]  DATE:02/28/2006                            DOB:          1946/03/07    [name] returns for followup following hospitalization for an acute post  polypectomy bleed.  Please see the history and physical examination,  discharge summary and colonoscopy by Dr. Leone Payor dated February 08, 2006 through  February 13, 2006.  The site of a tubulovillous adenoma bled and was treated  with clipping. Details in the above referenced admission note, discharge  summary and colonoscopy.  He has noted some slightly dark stool while on  iron following his hospitalization but he has no other gastrointestinal  complaints and feels well.  He notes a slight decrease in energy level.  Hemoglobin from February 23, 2006 was 11.7.   CURRENT MEDICATIONS:  Listed on the chart and are being reviewed.   MEDICATION ALLERGIES:  NONE KNOWN.   PHYSICAL EXAMINATION:  GENERAL:  In no acute distress.  VITAL SIGNS:  Weight 230 pounds, blood pressure 124/68, pulse 68 and  regular.  CHEST:  Clear to auscultation bilaterally.  CARDIAC:  Regular rate and rhythm without murmurs.  ABDOMEN:  Soft and nontender, nondistended.  Normoactive bowel sounds.   ASSESSMENT AND PLAN:  1. Postpolypectomy bleed--resolved.  2. Post hemorrhagic anemia.  Continue iron supplements on a daily basis as      well as daily fiber supplements.  Add daily Colace for constipation.      Repeat CBC in two weeks and if his hemoglobin has improved, we will      plan to discontinue iron at that time.  3. History of a tubulovillous adenoma with only a fair bowel preparation.      Repeat colonoscopy recommended for July 2009.  Given his bleeding we      will plan for a hematology consultation prior to that procedure to      assess for the unlikely possibility of a coagulation disorder.  The      procedure will be done off aspirin and all other anticoagulant and      antiplatelet agents.  I attempted to answer his multiple questions      regarding the procedure and its complication.                                   Venita Lick. Pleas Koch., MD, [facility]   MTS/MedQ  DD:  02/28/2006  DT:  02/28/2006  Job #:  [id number]   cc:   Quita Skye. Artis Flock, MD

## 2010-12-02 NOTE — Assessment & Plan Note (Signed)
[facility]                                   ON-CALL NOTE   DREXEL, IVEY                       MRN:          [id number]  DATE:  02/13/2006                              DOB:      [birth year]    Mr. Marland called tonight stating he has severe pain in his right toe.  He  has a history of gout.  He was just released from the hospital following a  post polypectomy bleed.   I instructed Mr. Zuckerman to begin colchicine 0.6 mg every 1 to 2 hours for a  maximum dose of 4.8 mg in 24 hours for what sounds like an acute gout  attack.  He was specifically told to avoid any antiinflammatory medicines  because of his recent GI bleed.                                   Barbette Hair. Arlyce Dice, MD, [facility]   RDK/MedQ  DD:  02/13/2006  DT:  02/14/2006  Job #:  [id number]   cc:   Venita Lick. Pleas Koch., MD, [facility]

## 2010-12-02 NOTE — Discharge Summary (Signed)
[name], [name]              ACCOUNT NO.:  [id number]   MEDICAL RECORD NO.:  [id number]          PATIENT TYPE:  INP   LOCATION:  2002                         FACILITY:  [facility]   PHYSICIAN:  Lina Sar, M.D. [facility]  DATE OF BIRTH:  Nov 24, 1945   DATE OF ADMISSION:  02/08/2006  DATE OF DISCHARGE:  02/13/2006                                 DISCHARGE SUMMARY   ADMITTING DIAGNOSES:  1. Acute lower GI bleed, presumed secondary to bleeding from site of      recent polypectomy.  2. Hypovolemia and hypotension associated with GI bleed.  3. Mild anemia secondary to acute blood loss.  4. Anxiety.  5. History of obstructive sleep apnea for which she uses nightly C-Pap.  6. History of mitral valve prolapse.  7. 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  8. Diverticulitis in 2000.  9. Status post appendectomy in 2000.  10.History of remote gastric ulcer with GI bleeding greater than 20 years      ago.   DISCHARGE DIAGNOSES:  1. Post colon polypectomy bleed status post colonoscopy with endo clip      application and epinephrine injection to the culprit lesion located in      the ascending colon.  2. History of both adenomatous and hypoplastic colon polyps awaiting the      pathology on the polyp which was removed on February 07, 2006.  3. Anxiety, heightened by situation with GI bleeding.  4. Hypokalemia, resolved.  5. Mild increased total bilirubin, mostly indirect fraction, consistent      with Gilbert's syndrome.  6. Mild hyperglycemia, question glucose intolerance.  No history of      diabetes.  7. Acute blood loss anemia for which he received a total of five units of      packed red blood cells during this admission.   CONSULTATIONS:  None.   PROCEDURES:  Colonoscopy on February 11, 2006 by Dr. Stan Head.  In the  ascending colon at the site of polypectomy were two clots of blood and  vessel protuberances consistent with nonbleeding, clotted vessels in a  polypectomy site.  This was treated with epinephrine and application of  three endoclips.  Also noted was a 5 mm transverse colon polyp which was not  removed.  Sigmoid diverticulosis and small internal hemorrhoids noted as  well.   BRIEF HISTORY:  Mr. Faucett is a pleasant 65 year old gentleman who has a  personal history of both adenomatous and hyperplastic colon polyps in 2002.  He had a follow up surveillance colonoscopy on February 07, 2006, by Dr. Russella Dar.  He had stayed off his aspirin for five days prior to the procedure.  Some  polyps were removed and the patient did not start back on the aspirin.  The  following day he felt a little bit weak but he did play golf.  That evening  he proceeded to pass a large amount of blood and felt presyncopal just  before this.  He contacted Dr. Juanda Chance and he was directly admitted to  hospital unit 2100, medical ICU.  On exam  he was hypotensive with blood  pressure 86/30 and pulse between 94 and 108.  Rectal exam was significant  for maroon blood.   LABORATORIES:  Hemoglobin initially 11.6.  A NADIR of 8.9 on February 09, 2006.  It was 9.3 at discharge.  Hematocrit was 26.7 at discharge.  Platelet count  was 198 initially.  A clotted specimen showed a false low level of 10.  It  was 131 at discharge.  PT initially was 15.4.  It was 14.5 at the recheck.  INR was 1.2.  PTT was 27.  Sodium 127, potassium low of 3.2 corrected to  3.6.  Glucose ranging from 98 up to 135.  BUN 18, creatinine 1.1, calcium  7.1, albumin 2.9, total protein 4.7, total bilirubin 1.4.  On recheck it was  2.1.  Indirect bilirubin 1.8.  AST 20, ALT 20 and alkaline phosphatase 51.  Troponin I 0.01, less than 0.01 and 0.02.  Urinalysis negative.   HOSPITAL COURSE:  The patient was kept in the ICU for four days.  Initially  the lower GI bleeding appeared to be slowing down but then it recurred  Sunday morning and Dr. Leone Payor prepped the patient and proceeded to the  colonoscopy where he  was able to treat the culprit polypectomy site.   Patient had serial blood counts and did require a total of five units of  packed red blood cells to be transfused.  Hemodynamically he was hypotensive  with blood pressures systolic often in the 90s to low 100s over 50s and 60s.  We did not re-start his blood pressure medication, Diovan, while he was  here.  He was advised to check his blood pressure at home and if he had  systolic blood pressures less than 130, then he should hold the Diovan.  The  patient said that he was planning to follow up with Dr. Deborah Chalk within the  next two to four weeks in order to re-evaluation his blood pressure  medications.   Patient's stool ultimately cleared following the colonoscopy and hemostatic  therapy.  He had multiple brown-colored stools.  Hemoglobin and hematocrit,  although they were initially stable following the colonoscopy, it did drop  from 10.2 to 9.3 within the last 24 hours of hospitalization.  However, the  patient's blood pressure was stable.  He was not dizzy, and he was felt safe  for discharge but the plan was to have him come back to Dr. Ardell Isaacs office  around February 23, 2006, and have evaluation of CBC then.  He did not have an  appointment set up to see Dr. Russella Dar in the office, however, he was advised  to call the office if he had any recurrent GI bleeding or felt woozy such as  he had just prior to admission.   The patient has fairly significant anxiety issues, especially during  stressful situations which certainly this was.  We were able to keep him  comfortable with p.r.n. Ativan as well as Xanax at night.  Once the bleeding  resolved, he seemed to calm down although he is quite inquisitive as to all  of his medical issues and was active in learning about what to expect and  what to watch for in terms of GI bleeding.   Because the patient had anemia and required significant blood transfusions, Dr. Juanda Chance elected to start a  single daily dose of iron for the next couple  of months.  Other medication adjustments were that he was to not continue  to  hold his 81 mg aspirin until February 24, 2006.  He was advised that he should  not play golf if it was excessively hot and to only play nine holes of golf  in the cool of the morning starting in five days.  He was okay to return to  work on Monday, February 19, 2006.  He is also not to lift more than 15 pounds  for two weeks.   DIET:  At discharge, low-fat, low-cholesterol.   MEDICATIONS:  At discharge:  1. Xanax 1 mg at bedtime.  2. Lipitor 10 mg daily.  3. Zyrtec 5/120 mg daily.  4. Lexapro 20 mg nightly.  5. Diovan dose not known one tablet daily.  He is to hold this if his      blood pressure is less than 130.  6. Zantac 150 mg daily.  7. Iron sulfate 325 mg once daily for eight weeks.  8. P.r.n. medications should he develop constipation could be stool      softener, Benefiber, or milk of magnesia.  9. Aspirin 81 mg is to be restarted on February 24, 2006.  He is to start      the [facility] office lab for repeat CBC.     ______________________________  Jennye Moccasin, PA-C      ______________________________  Lina Sar, M.D. [facility]    SG/MEDQ  D:  02/13/2006  T:  02/13/2006  Job:  [id number]   cc:   Rodolph Bong, M.D.  Colleen Can. Deborah Chalk, M.D.

## 2010-12-02 NOTE — Discharge Summary (Signed)
[facility]  Patient:    [name], [name]                     MRN: [id number] Adm. Date:  [id number] Disc. Date: [id number] Attending:  Farley Ly Dictator:   Jennette Kettle, M.D. CC:         Quita Skye. Artis Flock, M.D.  Rockey Situ. Flavia Shipper., M.D.  Colleen Can. Deborah Chalk, M.D.  Wilhemina Bonito. Eda Keys., M.D. [facility]   Discharge Summary  DISCHARGE DIAGNOSES: 1. Streptococcus bovis bacteremia. 2. Mitral valve prolapse. 3. History of diverticulitis (2000). 4. Appendectomy (2000). 5. Remote history of gastric ulcer bleeding approximately 20 years ago. 6. History of sleep apnea and uses continuous positive airway pressure    occasional at home at night.  DISCHARGE MEDICATIONS: 1. Rocephin 2 g via IV PICC line one time a day to be taken from February 14, 2001, through March 07, 2001, at home via home health agency. 2. Xanax 0.25 mg p.o. b.i.d. 3. Zantac 150 mg p.o. b.i.d. 4. Allegra 60 mg p.o. b.i.d.  FOLLOWUP:  Mr. Mcclish has a follow-up appointment with his primary care physician Dr. Artis Flock on February 18, 2001, at 5:36 p.m.  At this time, further evaluation of the patients fevers and chills and home health antibiotic use should be checked.  PICC line should be evaluated.  The home health agency will be sending weekly CBCs and a CMET to Dr. Artis Flock.  Colonoscopy polyp removal pathology should be followed up at this time.  The patient is to follow up with internal medicine/infectious disease physician, Dr. Roxan Hockey, in approximately two weeks.  The outpatient clinic will call the patient in regards to making this appointment date.  The outpatient clinic has the patients telephone number.  The patient will follow up with Dr. Deborah Chalk after his antibiotic use is finished.  Dr. Angelina Pih number is [phone].  PROCEDURES/DIAGNOSTIC STUDIES: 1. Peripherally inserted central catheter was placed via radiology on February 08, 2001. 2. Transthoracic echocardiogram was  performed on February 08, 2001, with the    impression being overall left ventricular systolic function estimated to be    65-75%.  There was no evidence of regional wall motion abnormalities.    There was moderate focal basal septal hypertrophy.  The aortic valve    thickness was mildly increased.  There was mild mitral valvular    regurgitation. 3. A transesophageal echocardiogram was performed on February 11, 2001, with the    results being no obvious vegetations.  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  Endocarditis could not be completely ruled out.  Moderate to    severe mitral regurgitation. 4. Computed tomography of the abdomen and pelvis was performed on February 11, 2001, with the impression being right lower lobe atelectasis and/or    infiltrate with a small right pleural effusion.  There were small simple    cysts in the liver which were unchanged from a prior study.  There was    diverticulosis in the sigmoid and left colon without evidence of    inflammation, perforation or abscess. 5. Colonoscopy was performed on February 12, 2001, with impression being    descending and sigmoid colon diverticulosis, internal hemorrhoids, five    colonic polyps excised with four being sent to pathology for further    evaluation.  CONSULTATIONS: 1. Dr. Roger Shelter with cardiology. 2. Dr. Roxan Hockey with infectious disease. 3.  Dr. Yancey Flemings with gastroenterology. 4. Dr. Bradd Canary, primary care physician. 5. Cardiovascular thoracic surgery.  HISTORY OF PRESENT ILLNESS:  The patient initially presented on February 07, 2001, with a chief complaint of fevers, chills and sweats x 3 weeks.  [name] is a 65 year old, white male with past medical history significant of mitral valve prolapse x 10 years for which he sees Dr. Deborah Chalk.  The patient states that he has been having fevers, chills and sweats for the last three weeks. He was initially seen by his primary care physician three weeks ago  and was diagnosed with a viral illness.  The symptoms persisted and he returned to Dr. Artis Flock.  At that time, blood cultures were obtained on July 22, as well as on July 23, which were initially thought to be Enterococcus.  Dr. Artis Flock was notified of these results and contacted Dr. Maurice March in infectious disease.  Under the guidance of Dr. Maurice March, the patient was admitted to the hospital for further work-up of his bacteremia.  Upon admission, the patient denies any recent dental work other than a regular cleaning performed in May 2002.  The patient does not have a history of hemorrhoids that he knows of.  He denies any bright red blood per rectum, dark tarry stools, diarrhea or constipation.  The patient denies any history of trauma other than some right lower leg trauma which occurred about four weeks prior to admission where he had scrapes on his shin.  The patient denies any dysuria or hematuria.  The patient also denies any recent cough or purulent sputum.  The patient denies neck stiffness, photophobia and has not been treated with any antibiotics within the last three weeks.  LABORATORY DATA AND X-RAY FINDINGS:  Initial CBC on February 07, 2001, revealed a white count of 12.4, hemoglobin 12.4, platelet count of 316 with an absolute neutrophil count elevated at 10.0.  The patients electrolytes revealed a sodium of 137, potassium 4.2, chloride 104, bicarb 27, glucose 125, BUN 15, creatinine 1.0, bilirubin 1.3.  Liver enzymes revealed an Alk phos of 94, AST 28, ALT slightly elevated at 54 with a total protein of 6.9, albumin 3.4 and calcium 8.3.  The patients blood cultures x 2 taken on February 07, 2001, revealed Streptococcus bovis which was susceptible to vancomycin and ampicillin.  HOSPITAL COURSE:  The patient was initially admitted to the hospital and  cardiology was consulted.  The patient underwent a PICC line placement as long-term antibiotic use would be likely.  Cardiology performed  a transthoracic echocardiogram with results as outlined above.  They also performed a transesophageal echocardiogram which showed no vegetations, but the mitral valve did have significant mitral regurgitation and was thickened. Endocarditis could not be ruled out.  The patient also underwent a CT scan of the abdomen looking for other focal areas of abscess.  The CT scan was negative for liver abscesses or other abnormalities other than some diverticula that were noticed in the colon.  The patient was initially started on ampicillin and gentamicin until specificity on the outside lab blood cultures came back as Streptococcus bovis.  On hospital day #2, his antibiotics were changed to Rocephin 2 g IV q.d.  Of note, the patient is not allergic to penicillin.  Given the nature of the organism, the patient underwent a colonoscopy for further evaluation of any gastrointestinal lesion such as polyps or cancers.  The patients colonoscopy revealed five polyps which were excised and four were sent to pathology and are  currently pending with pathology results.  During the patients hospital course, he remained afebrile with an elevated white count.  The patient is being discharged home with home health IV Rocephin to be administered one time a day.  Home health will also check a CBC and CMET on a weekly basis.  These results will be sent to Dr. Artis Flock, which is the patients primary care physician.  The patient has a follow-up appointment with Dr. Artis Flock on February 18, 2001.  Dr. Roxan Hockey with infectious disease would also like to see the patient in the next two weeks and will arrange that appointment.  Upon completion of his antibiotic treatment, Dr. Deborah Chalk in cardiology would like to see the patient for further evaluation of the patient s moderate to severe mitral regurgitation.  Pending pathology results on the colon polyps, the patient may need further evaluation with gastroenterology which should be  arranged by Dr. Artis Flock.  Upon discharge from the hospital, the patient was afebrile with normal blood pressure and other vital signs.  DISCHARGE LABORATORY DATA AND X-RAY FINDINGS:  The patients CBC upon discharge revealed a white blood count of 10.5, hemoglobin 12.6, platelets 362.  The patients electrolytes revealed a sodium of 137, potassium 3.6, chloride 106, bicarb 27, glucose 123, BUN 15, creatinine 1.0 and calcium 8.4. DD:  02/13/01 TD:  02/13/01 Job: [id number]

## 2010-12-02 NOTE — H&P (Signed)
[name], [name]              ACCOUNT NO.:  [id number]   MEDICAL RECORD NO.:  [id number]          PATIENT TYPE:  INP   LOCATION:  2105                         FACILITY:  [facility]   PHYSICIAN:  Lina Sar, M.D. [facility]  DATE OF BIRTH:  09-18-1945   DATE OF ADMISSION:  02/08/2006  DATE OF DISCHARGE:                                HISTORY & PHYSICAL   CHIEF COMPLAINT:  Passing large volumes of blood per rectum and feeling  quite dizzy.   HISTORY OF PRESENT ILLNESS:  [name] is a 65 year old gentleman who has a  family history of colon polyps.  He also has a remote history of gastric  ulcer bleeding more than 20 years ago.  He has had diverticulitis.  The  patient underwent a screening colonoscopy by Dr. Claudette Head on July 25.  At colonoscopy, multiple colon polyps were encountered in the ascending and  transverse colon which were removed.  Total polyps removed look to be 4 in  number.  The patient went home and was stable.  The following day in the  afternoon after playing golf, he felt dizzy while walking up stairs and  proceeded to have a couple of episodes around 7:00 p.m. of large-volume  hematochezia.  He had felt some malaise earlier in the day.  The called Dr.  Juanda Chance and came directly to the hospital and was admitted to the ICU.  His  hemoglobin at arrival was 11.6, and hematocrit was 33.2.  The patient denies  abdominal pain., prior history of rectal bleeding.   ALLERGIES:  No medical allergies.  He is allergic to POLLEN.   MEDICATIONS:  1. Xanax 1 mg p.o. at h.s.  2. Lipitor 10 mg daily.  3. Zyrtec 5/120 twice daily.  4. Lexapro 20 mg at h.s.  5. Diovan 1 tablet daily.  6. Zantac 150 mg daily.  7. Aspirin 81 mg daily.  Note that he had been off aspirin for 5 days      prior to colonoscopy and had not restarted this medication.   PAST MEDICAL HISTORY:  1. Streptococcus bovis bacteremia in July 2002.  2. Mitral valve prolapse.  3. History of mitral valve  endocarditis  4. History of diverticulitis in 2000.  5. Status post appendectomy and 2000.  6. Remote history of gastric ulcer with GI bleeding more than 20 years      ago.  7. Obstructive sleep apnea.  He uses CPAP nightly.   SOCIAL HISTORY:  The patient works 50+ hours a week.  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  He travels  to [place] and [place] for his work.  The patient does not smoke.  The  patient drinks one to two glasses of wine or beer a day most days at the  week.  He lives in [place] with his wife.  He has adult children.   FAMILY HISTORY:  His father had a stroke and emphysema.  His mother had  breast cancer and anxiety.  There is a lot of hypertension in siblings, and  his parents had  it.  Both of his paternal grandparents had colon cancer.  There is also a history of a what sounds like a ruptured aneurysm and/or a  bleeding CVA.   REVIEW OF SYSTEMS:  The patient does use SBE prophylaxis for dental  procedures.  He reports urinary frequency.  Does not use any NSAID's and  does not have significant or disabling arthritis symptoms.  Reports no  difficulty breathing or sleeping.  No chest pain.  No extremity edema.  No  palpitations.   PHYSICAL EXAMINATION:  VITAL SIGNS: Blood pressure 115/57, pulse 76,  respirations 10, temperature 98.4.  Weight is 106 kg.  He is 6 feet 2 inches  tall.  GENERAL: The patient is a pleasant, anxious white gentleman. He is not pale.  HEENT:  Sclera nonicteric.  Conjunctiva is pink.  Extraocular movements are  intact.  Oropharynx and mucous membranes are moist and clear.  Tongue is  midline.  NECK:  There is no JVD, no masses, no bruits.  COR: There is regular rate and rhythm.  No murmurs, rubs or gallops  appreciated.  CHEST: Clear to auscultation and percussion bilaterally.  ABDOMEN: His abdomen is a somewhat distended.  It is nontender.  No  hepatosplenomegaly.  Bowel sounds are  active.  No bruits.  RECTAL:  Exam noted for maroon bloody stool.  No masses.  PSYCHIATRIC: The patient is appropriate.  He is anxious.  NEUROLOGIC:  The patient moves all four limbs easily.  There is no tremor.  Marland Kitchen   LABORATORY DATA:  Hemoglobin 11.6, hematocrit 33.2.  MCV 96.6.  Platelets  198,000.  PT 15.4, INR 1.2, PTT 27.  White blood cell count 9.4.  The  urinalysis is negative.  The sodium is 137, potassium 3.6.  Glucose 135.  BUN is 18, creatinine 1.1.  Albumin 3.5.  Total bilirubin 1.4, alkaline  phosphatase 79, AST 22, ALT 27.   IMPRESSION:  1. Gastrointestinal bleed. This is most likely as a post polypectomy      bleed.  He does have known diverticulosis and sigmoid colon and      nonbleeding internal hemorrhoids when he was colonoscoped the day      prior to admission.  Doubt that either of these are the source of the      bleeding, is less likely, and in the case of hemorrhoids, doubtful that      this is the source of the bleeding given the volume of blood he passed.      GI bleed probably diverticular, less likely hemorrhoids or      diverticulosis. The patient's bleed has made him symptomatic in terms      of his experiencing dizziness, so he is likely hypovolemic from the GI      bleeding.  2. Mild anemia.  Hemoglobin and hematocrit will need to be closely      monitored as we expect this to drop given his acute GI bleeding.  3. Anxiety.  Plan to treat with p.r.n. benzodiazepines and continue his      antidepressant medication.  4. History of obstructive sleep apnea for which he uses CPAP.  Will of the      wife bring the CPAP machine in so that he can continue using this while      hospitalized.  5. History of mitral valve prolapse with history of Streptococcus bovis      bacteremia and endocarditis a few years ago.  Plan as above in the  assessment. As well, the patient to be continued on IV fluids.  He has     been typed and crossed, and Dr. Juanda Chance is  initiating transfusions with      2 units of packed red blood cells to start and closely monitoring      hemoglobin and hematocrit thereafter.  Diet limited to ice chips.   PLAN:  Monitor closely in the ICU until resolution of GI bleeding.  The  patient may require a colonoscopy, but this is not planned as of yet.     ______________________________  Jennye Moccasin, PA-C      ______________________________  Lina Sar, M.D. [facility]    SG/MEDQ  D:  02/11/2006  T:  02/11/2006  Job:  [id number]

## 2010-12-05 ENCOUNTER — Other Ambulatory Visit: Payer: Self-pay | Admitting: Internal Medicine

## 2010-12-05 ENCOUNTER — Other Ambulatory Visit: Payer: Self-pay | Admitting: Dermatology

## 2010-12-26 ENCOUNTER — Ambulatory Visit (INDEPENDENT_AMBULATORY_CARE_PROVIDER_SITE_OTHER): Payer: No Typology Code available for payment source | Admitting: Cardiology

## 2010-12-26 ENCOUNTER — Encounter: Payer: Self-pay | Admitting: Cardiology

## 2010-12-26 ENCOUNTER — Telehealth: Payer: Self-pay | Admitting: *Deleted

## 2010-12-26 VITALS — BP 132/64 | HR 109 | Ht 74.0 in | Wt 243.0 lb

## 2010-12-26 DIAGNOSIS — I4891 Unspecified atrial fibrillation: Secondary | ICD-10-CM

## 2010-12-26 LAB — CBC WITH DIFFERENTIAL/PLATELET
Basophils Absolute: 0 10*3/uL (ref 0.0–0.1)
Basophils Relative: 0.2 % (ref 0.0–3.0)
Eosinophils Absolute: 0.4 10*3/uL (ref 0.0–0.7)
HCT: 43.5 % (ref 39.0–52.0)
Hemoglobin: 15.4 g/dL (ref 13.0–17.0)
Lymphs Abs: 1.6 10*3/uL (ref 0.7–4.0)
MCHC: 35.4 g/dL (ref 30.0–36.0)
MCV: 97 fl (ref 78.0–100.0)
Monocytes Absolute: 0.8 10*3/uL (ref 0.1–1.0)
Neutro Abs: 5 10*3/uL (ref 1.4–7.7)
RDW: 13.3 % (ref 11.5–14.6)

## 2010-12-26 LAB — BASIC METABOLIC PANEL
BUN: 15 mg/dL (ref 6–23)
Calcium: 8.7 mg/dL (ref 8.4–10.5)
Creatinine, Ser: 1 mg/dL (ref 0.4–1.5)

## 2010-12-26 LAB — HEPATIC FUNCTION PANEL
Bilirubin, Direct: 0.3 mg/dL (ref 0.0–0.3)
Total Bilirubin: 1.7 mg/dL — ABNORMAL HIGH (ref 0.3–1.2)

## 2010-12-26 NOTE — Progress Notes (Signed)
Subjective:    [name] was exercising on his treadmill today when he became short of breath and more diaphoretic and noted some uneasiness and palpitations. He is seen today and his initial EKG shows recurrence of atrial fibrillation with ventricular response of 109. He had cardioversion in July 2011 for atrial fibrillation. He followed him on warfarin anticoagulation because of his desire to stop that, older monitor was performed which showed no atrial fibrillation and his warfarin was discontinued in April. He has been on vacation and I assume he has had some dietary not the client and probably increases alcohol consumption. He is back in atrial fibrillation today.  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 His last 2-D echocardiogram was in June of 2011. His EF was normal. He had mild to moderate mitral regurgitation and moderate biatrial enlargement. He does have mitral valve prolapse.  His other problems include obesity, alcohol consumption, hypertension, dyslipidemia, gout, sleep apnea, back pain, and mild depression.  Current Outpatient Prescriptions  Medication Sig Dispense Refill  . allopurinol (ZYLOPRIM) 300 MG tablet Take 300 mg by mouth daily.       Marland Kitchen ALPRAZolam (XANAX) 1 MG tablet Take 1 mg by mouth daily.        Marland Kitchen aspirin 81 MG tablet Take 81 mg by mouth daily.        . citalopram (CELEXA) 40 MG tablet Take 1 tablet (40 mg total) by mouth daily.  90 tablet  6  . colchicine (COLCRYS) 0.6 MG tablet Take 0.6 mg by mouth 4 (four) times daily as needed. Gout pain       . diltiazem (CARDIZEM CD) 240 MG 24 hr capsule Take 240 mg by mouth daily.        . fexofenadine (ALLEGRA) 180 MG tablet Take 1 tablet (180 mg total) by mouth daily.  60 tablet  6  . furosemide (LASIX) 20 MG tablet Take 20 mg by mouth. Monday - Wednesday-Friday       . multivitamin (THERAGRAN) per tablet Take 1 tablet by mouth daily.        Marland Kitchen NIASPAN 500 MG CR tablet  take 2 tablets by mouth once daily  180 tablet  3  . NON FORMULARY CPAP       . Omega-3 Fatty Acids (FISH OIL) 1000 MG CAPS Take 1 capsule by mouth 2 (two) times daily.        Marland Kitchen omeprazole (PRILOSEC) 40 MG capsule Take 40 mg by mouth daily.        . rosuvastatin (CRESTOR) 10 MG tablet Take 10 mg by mouth daily.        . Tamsulosin HCl (FLOMAX) 0.4 MG CAPS Take 1 capsule (0.4 mg total) by mouth daily after supper.  60 capsule  4  . Ascorbic Acid (VITAMIN C) 500 MG tablet Take 500 mg by mouth daily. ( NOT TAKING )      . b complex vitamins tablet Take 1 tablet by mouth daily. ( NOT TAKING )      . warfarin (COUMADIN) 5 MG tablet Take 5 mg by mouth daily. ( NOT TAKING )   As directed      . DISCONTD: atorvastatin (LIPITOR) 20 MG tablet Take 1 tablet (20 mg total) by mouth daily.  90 tablet  6  . DISCONTD: citalopram (CELEXA) 40 MG tablet Take 40 mg by mouth daily.          No  Known Allergies  Patient Active Problem List  Diagnoses  . TUBULOVILLOUS ADENOMA, COLON  . HYPOGONADISM  . HYPERCHOLESTEROLEMIA  . GOUT  . ANXIETY  . DEPRESSION  . OBSTRUCTIVE SLEEP APNEA  . RESTLESS LEGS SYNDROME  . HYPERTENSION  . MITRAL VALVE PROLAPSE  . ENDOCARDITIS  . ATRIAL FIBRILLATION  . ALLERGIC RHINITIS  . GERD  . DIVERTICULOSIS, COLON  . DYSPNEA ON EXERTION  . GASTROINTESTINAL HEMORRHAGE, HX OF  . Impaired glucose tolerance    History  Smoking status  . Former Smoker -- 1.0 packs/day for 20 years  . Types: Cigarettes  . Quit date: 07/17/1978  Smokeless tobacco  . Never Used    History  Alcohol Use  . 3.5 oz/week  . 7 drink(s) per week    No family history on file.  Review of Systems:   The patient denies any heat or cold intolerance.  No weight gain or weight loss.  The patient denies headaches or blurry vision.  There is no cough or sputum production.  The patient denies dizziness.  There is no hematuria or hematochezia.  The patient denies any muscle aches or arthritis.  The  patient denies any rash.  The patient denies frequent falling or instability.  There is no history of depression or anxiety.  All other systems were reviewed and are negative.   Physical Exam:   He is in no acute distress. Blood pressure is 132/64. Heart rate is 109 and irregularly irregular. Weight is 243.The head is normocephalic and atraumatic.  Pupils are equally round and reactive to light.  Sclerae nonicteric.  Conjunctiva is clear.  Oropharynx is unremarkable.  There's adequate oral airway.  Neck is supple there are no masses.  Thyroid is not enlarged.  There is no lymphadenopathy.  Lungs are clear.  Chest is symmetric.  Heart shows a irregular rate and rhythm.  S1 and S2 are normal.  There is an apical grade 2/6 systolic murmur.  Abdomen is soft normal bowel sounds.  There is no organomegaly.  Genital and rectal deferred.  Extremities are without edema.  Peripheral pulses are adequate.  Neurologically intact.  Full range of motion.  The patient is not depressed.  Skin is warm and dry.  Assessment / Plan:

## 2010-12-26 NOTE — Telephone Encounter (Signed)
Patient called with lab results/msg left. Jodette Amarys Sliwinski RN  

## 2010-12-26 NOTE — Assessment & Plan Note (Signed)
He has recurrent atrial fibrillation and I think at this point in time, he needs to be committed to long-term warfarin anticoagulation. I will give him Coumadin 10 mg each day for 3 days and then starting back on his previous regimen of warfarin 7-1/2 mg on 4 days a week and 5 mg on 3 days a week. We'll check a CBC, C. Met, TSH today. I'll check an INR in one week.  I will have him see Lawson Fiscal in 3 weeks. His EKG today showed atrial fibrillation and otherwise was normal.I will have him see Dr. Shirlee Latch in followup for planned cardioversion if needed. We will continue diltiazem 240 mg per day

## 2010-12-28 ENCOUNTER — Encounter: Payer: Self-pay | Admitting: Cardiology

## 2010-12-29 ENCOUNTER — Ambulatory Visit (INDEPENDENT_AMBULATORY_CARE_PROVIDER_SITE_OTHER): Payer: No Typology Code available for payment source | Admitting: Internal Medicine

## 2010-12-29 ENCOUNTER — Encounter: Payer: Self-pay | Admitting: Internal Medicine

## 2010-12-29 DIAGNOSIS — I10 Essential (primary) hypertension: Secondary | ICD-10-CM

## 2010-12-29 DIAGNOSIS — I4891 Unspecified atrial fibrillation: Secondary | ICD-10-CM

## 2010-12-29 DIAGNOSIS — E7800 Pure hypercholesterolemia, unspecified: Secondary | ICD-10-CM

## 2010-12-29 MED ORDER — FEXOFENADINE HCL 180 MG PO TABS: 180.0000 mg | ORAL_TABLET | Freq: Every day | ORAL | Status: AC

## 2010-12-29 MED ORDER — ESCITALOPRAM OXALATE 20 MG PO TABS: 20.0000 mg | ORAL_TABLET | Freq: Every day | ORAL | Status: AC

## 2010-12-29 NOTE — Progress Notes (Signed)
  Subjective:    Patient ID: [name], male    DOB: 1945-11-19, 65 y.o.   MRN: [id number]  HPI  65 year old patient who is seen today for followup. He has a history of atrial fibrillation that was precipitated in the setting of acute pneumonia. After being stable for some time he was given a trial of anticoagulation but has had recurrent atrial fibrillation. He is now being followed by cardiology he is back on Coumadin anticoagulation and is scheduled for elective cardioversion at the end of next month. He has treated hypertension and dyslipidemia that has been stable. He has a history of depression and states that he did better on Lexapro in the past. He is now on Celexa. He has a history of gout that has been stable.    Review of Systems  Constitutional: Negative for fever, chills, appetite change and fatigue.  HENT: Negative for hearing loss, ear pain, congestion, sore throat, trouble swallowing, neck stiffness, dental problem, voice change and tinnitus.   Eyes: Negative for pain, discharge and visual disturbance.  Respiratory: Negative for cough, chest tightness, wheezing and stridor.   Cardiovascular: Negative for chest pain, palpitations and leg swelling.  Gastrointestinal: Negative for nausea, vomiting, abdominal pain, diarrhea, constipation, blood in stool and abdominal distention.  Genitourinary: Negative for urgency, hematuria, flank pain, discharge, difficulty urinating and genital sores.  Musculoskeletal: Negative for myalgias, back pain, joint swelling, arthralgias and gait problem.  Skin: Negative for rash.  Neurological: Negative for dizziness, syncope, speech difficulty, weakness, numbness and headaches.  Hematological: Negative for adenopathy. Does not bruise/bleed easily.  Psychiatric/Behavioral: Negative for behavioral problems and dysphoric mood. The patient is nervous/anxious.        Objective:   Physical Exam  Constitutional: He is oriented to person, place, and  time. He appears well-developed.  HENT:  Head: Normocephalic.  Right Ear: External ear normal.  Left Ear: External ear normal.  Eyes: Conjunctivae and EOM are normal.  Neck: Normal range of motion.  Cardiovascular: Normal rate and normal heart sounds.        Rhythm is irregular consistent with atrial fibrillation  Grade 2/6 systolic murmur  Pulmonary/Chest: Breath sounds normal.  Abdominal: Bowel sounds are normal.  Musculoskeletal: Normal range of motion. He exhibits no edema and no tenderness.  Neurological: He is alert and oriented to person, place, and time.  Psychiatric: He has a normal mood and affect. His behavior is normal.          Assessment & Plan:   Atrial fibrillation. Continue Coumadin anticoagulation and rate control. Followup cardiology Hypertension well controlled Depression. We'll switch back to Lexapro now that this is generic. He fell he did much better on this medication Dyslipidemia. We'll continue Crestor

## 2010-12-29 NOTE — Patient Instructions (Signed)
Limit your sodium (Salt) intake    It is important that you exercise regularly, at least 20 minutes 3 to 4 times per week.  If you develop chest pain or shortness of breath seek  medical attention.  Return in 4 months for follow-up   

## 2011-01-02 ENCOUNTER — Ambulatory Visit (INDEPENDENT_AMBULATORY_CARE_PROVIDER_SITE_OTHER): Payer: No Typology Code available for payment source | Admitting: *Deleted

## 2011-01-02 ENCOUNTER — Other Ambulatory Visit: Payer: No Typology Code available for payment source | Admitting: *Deleted

## 2011-01-02 DIAGNOSIS — I4891 Unspecified atrial fibrillation: Secondary | ICD-10-CM

## 2011-01-12 ENCOUNTER — Ambulatory Visit (INDEPENDENT_AMBULATORY_CARE_PROVIDER_SITE_OTHER): Payer: No Typology Code available for payment source | Admitting: Nurse Practitioner

## 2011-01-12 ENCOUNTER — Encounter: Payer: Self-pay | Admitting: Nurse Practitioner

## 2011-01-12 ENCOUNTER — Ambulatory Visit (INDEPENDENT_AMBULATORY_CARE_PROVIDER_SITE_OTHER): Payer: No Typology Code available for payment source | Admitting: *Deleted

## 2011-01-12 ENCOUNTER — Encounter (INDEPENDENT_AMBULATORY_CARE_PROVIDER_SITE_OTHER): Payer: No Typology Code available for payment source | Admitting: *Deleted

## 2011-01-12 DIAGNOSIS — I059 Rheumatic mitral valve disease, unspecified: Secondary | ICD-10-CM

## 2011-01-12 DIAGNOSIS — I4891 Unspecified atrial fibrillation: Secondary | ICD-10-CM

## 2011-01-12 DIAGNOSIS — I10 Essential (primary) hypertension: Secondary | ICD-10-CM

## 2011-01-12 DIAGNOSIS — Z7901 Long term (current) use of anticoagulants: Secondary | ICD-10-CM

## 2011-01-12 NOTE — Assessment & Plan Note (Signed)
Blood pressure is ok for now. We will continue with his current medicines.

## 2011-01-12 NOTE — Assessment & Plan Note (Signed)
Last echo was one year ago. We will see if Dr. Shirlee Latch wishes for it to be repeated prior to cardioversion.

## 2011-01-12 NOTE — Patient Instructions (Addendum)
We need to have your coumadin levels good for a total of 4 weeks. Check your protime weekly We will see you back in one month Stay on your current medicines Call for any problems.  I encourage you to modify your alcohol consumption

## 2011-01-12 NOTE — Assessment & Plan Note (Signed)
His rate is better. He remains on Coumadin. He does desire repeat cardioversion. He does not want to be on long term Coumadin, but this may be indicated. He needs for his INR's to be therapeutic for at least 3 to 4 weeks before proceeding with cardioversion. I will ask Dr. Shirlee Latch if he would like echo repeated. I will see him back in about one month. I have asked him to modify his alcohol use, but I doubt he can follow thru. Patient is agreeable to this plan and will call if any problems develop in the interim.

## 2011-01-12 NOTE — Assessment & Plan Note (Signed)
He is back on coumadin. Will need to check weekly.

## 2011-01-12 NOTE — Progress Notes (Signed)
[name] Date of Birth: [birth year]   History of Present Illness: [name] is seen back today for a follow up visit. He is back on coumadin and diltiazem for recurrent atrial fib. He remains a little fatigued. He continues to drink alcohol. Quantity is hard to quantify. Dr. Deborah Chalk felt this was the trigger for his recurrent arrhythmia. He had also been using some cold medicine.  He is not lightheaded or dizzy. No chest pain or palpitations reported. He is not yet therapeutic on his coumadin. He does desire repeat cardioversion. His last echo was in June of 2011. EF was normal. He has mild to moderate MR with MVP, TR, biatrial enlargement, mild pulmonary HTN and mild AI. His [place] score is 1-2 (HTN, glucose intolerance) No definitive diagnosis of diabetes. No stroke or CHF history.   Current Outpatient Prescriptions on File Prior to Visit  Medication Sig Dispense Refill  . allopurinol (ZYLOPRIM) 300 MG tablet Take 300 mg by mouth daily.       Marland Kitchen ALPRAZolam (XANAX) 1 MG tablet Take 1 mg by mouth daily.        Marland Kitchen aspirin 81 MG tablet Take 81 mg by mouth daily.        . colchicine (COLCRYS) 0.6 MG tablet Take 0.6 mg by mouth 4 (four) times daily as needed. Gout pain       . diltiazem (CARDIZEM CD) 240 MG 24 hr capsule Take 240 mg by mouth daily.        Marland Kitchen escitalopram (LEXAPRO) 20 MG tablet Take 1 tablet (20 mg total) by mouth daily.  90 tablet  6  . fexofenadine (ALLEGRA) 180 MG tablet Take 1 tablet (180 mg total) by mouth daily.  60 tablet  6  . furosemide (LASIX) 20 MG tablet Take 20 mg by mouth. Monday - Wednesday-Friday       . Melatonin 3 MG CAPS Take by mouth at bedtime.        . multivitamin (THERAGRAN) per tablet Take 1 tablet by mouth daily.        Marland Kitchen NIASPAN 500 MG CR tablet take 2 tablets by mouth once daily  180 tablet  3  . NON FORMULARY CPAP       . Omega-3 Fatty Acids (FISH OIL) 1000 MG CAPS Take 1 capsule by mouth 2 (two) times daily.        Marland Kitchen omeprazole (PRILOSEC) 40 MG capsule  Take 40 mg by mouth daily.        . rosuvastatin (CRESTOR) 10 MG tablet Take 10 mg by mouth daily.        . Tamsulosin HCl (FLOMAX) 0.4 MG CAPS Take 1 capsule (0.4 mg total) by mouth daily after supper.  60 capsule  4  . warfarin (COUMADIN) 5 MG tablet Take 5 mg by mouth daily. ( NOT TAKING )   As directed        No Known Allergies  Past Medical History  Diagnosis Date  . ALLERGIC RHINITIS 12/02/2009  . ANXIETY 06/17/2007  . Atrial fibrillation 12/30/2009    s/p cardioversion  . DEPRESSION 06/17/2007  . DIVERTICULOSIS, COLON 02/11/2006  . DYSPNEA ON EXERTION 12/20/2009  . ENDOCARDITIS July 2002  . GASTROINTESTINAL HEMORRHAGE, HX OF     Remote history  . GERD 07/16/2007  . GOUT 06/17/2007  . HYPERCHOLESTEROLEMIA 06/17/2007  . HYPERTENSION 06/17/2007  . HYPOGONADISM 05/18/2009  . Impaired glucose tolerance 09/01/2010  . MITRAL VALVE PROLAPSE 06/17/2007  . OBSTRUCTIVE SLEEP APNEA 07/29/2007  .  RESTLESS LEGS SYNDROME 07/29/2007  . 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 URI 06/08/2009    Past Surgical History  Procedure Date  . Appendectomy   . Vasectomy   . Septoplasty   . Cardioversion July 2011  . US echocardiography June 2011    Mild to moderate MR with MVP, TR, moderate biatrial enlargement  . Nuclear stress test Dec 2009    Normal    History  Smoking status  . Former Smoker -- 1.0 packs/day for 20 years  . Types: Cigarettes  . Quit date: 07/17/1978  Smokeless tobacco  . Never Used    History  Alcohol Use  . 3.5 oz/week  . 7 drink(s) per week    Family History  Problem Relation Age of Onset  . Heart disease Mother   . Breast cancer Mother   . Stroke Father     Review of Systems: The review of systems is positive for some fatigue. He doesn't exercise regularly. No dizziness or syncope.  All other systems were reviewed and are negative.  Physical Exam: BP 134/84  Pulse 78  Wt 240 lb (108.863 kg) Patient is alert and in no acute distress. He is obese. Skin  is warm and dry. Color is normal.  HEENT is unremarkable. Normocephalic/atraumatic. PERRL. Sclera are nonicteric. Neck is supple. No masses. No JVD. Lungs are clear. Cardiac exam shows an irregular rhythm. Rate is controlled. Abdomen is soft and obese. Extremities are without edema. Gait and ROM are intact. No gross neurologic deficits noted.  LABORATORY DATA: INR is pending   Assessment / Plan:

## 2011-01-13 ENCOUNTER — Other Ambulatory Visit: Payer: Self-pay | Admitting: Internal Medicine

## 2011-01-15 NOTE — Progress Notes (Signed)
Would go ahead and get a repeat echo, mainly to see if MR has worsened over the last year (was mild to moderate before).

## 2011-01-20 LAB — PROTIME-INR: INR: 3.2 — AB (ref <=1.1)

## 2011-01-23 NOTE — Progress Notes (Signed)
Please call Mr. Sennett. Let him know that I have reviewed his case with Dr. Shirlee Latch. Dr. Shirlee Latch would like to have an updated echo prior to cardioversion. Please schedule.

## 2011-01-24 ENCOUNTER — Telehealth: Payer: Self-pay | Admitting: Cardiology

## 2011-01-24 ENCOUNTER — Ambulatory Visit: Payer: Self-pay | Admitting: *Deleted

## 2011-01-24 DIAGNOSIS — I4891 Unspecified atrial fibrillation: Secondary | ICD-10-CM

## 2011-01-24 DIAGNOSIS — I340 Nonrheumatic mitral (valve) insufficiency: Secondary | ICD-10-CM

## 2011-01-24 NOTE — Telephone Encounter (Signed)
Spoke with pt regarding scheduling echo. He would prefer early morning or later afternoon. Echo scheduled for 7/19 @ 930. Also coumadin check scheduled for 7/19 @ 845. Called pt back left message regarding scheduled times. Left call back numbers for this office and [facility].

## 2011-01-24 NOTE — Telephone Encounter (Signed)
Left message for pt that he needs an echocardiogram. Phone number provided for call back.

## 2011-01-26 ENCOUNTER — Ambulatory Visit (INDEPENDENT_AMBULATORY_CARE_PROVIDER_SITE_OTHER): Payer: No Typology Code available for payment source | Admitting: *Deleted

## 2011-01-26 DIAGNOSIS — I4891 Unspecified atrial fibrillation: Secondary | ICD-10-CM

## 2011-01-30 LAB — POCT INR: INR: 2.1

## 2011-02-02 ENCOUNTER — Encounter: Payer: No Typology Code available for payment source | Admitting: *Deleted

## 2011-02-02 ENCOUNTER — Ambulatory Visit (INDEPENDENT_AMBULATORY_CARE_PROVIDER_SITE_OTHER): Payer: No Typology Code available for payment source | Admitting: *Deleted

## 2011-02-02 ENCOUNTER — Ambulatory Visit (HOSPITAL_COMMUNITY): Payer: No Typology Code available for payment source | Attending: Internal Medicine | Admitting: Radiology

## 2011-02-02 DIAGNOSIS — I10 Essential (primary) hypertension: Secondary | ICD-10-CM | POA: Insufficient documentation

## 2011-02-02 DIAGNOSIS — I080 Rheumatic disorders of both mitral and aortic valves: Secondary | ICD-10-CM | POA: Insufficient documentation

## 2011-02-02 DIAGNOSIS — I4891 Unspecified atrial fibrillation: Secondary | ICD-10-CM | POA: Insufficient documentation

## 2011-02-02 DIAGNOSIS — I340 Nonrheumatic mitral (valve) insufficiency: Secondary | ICD-10-CM

## 2011-02-02 DIAGNOSIS — E785 Hyperlipidemia, unspecified: Secondary | ICD-10-CM | POA: Insufficient documentation

## 2011-02-02 DIAGNOSIS — I079 Rheumatic tricuspid valve disease, unspecified: Secondary | ICD-10-CM | POA: Insufficient documentation

## 2011-02-02 LAB — POCT INR: INR: 2.2

## 2011-02-07 ENCOUNTER — Telehealth: Payer: Self-pay | Admitting: *Deleted

## 2011-02-07 NOTE — Telephone Encounter (Signed)
Will set up for cardioversion after he returns to town.  ----- Message ----- From: Luan Moore, RN Sent: 02/03/2011 7:55 AM To: Marca Ancona, MD, Jacqlyn Krauss, RN  Pt has had 4 consecutive INRs greater than 2. Pt is ready for DCCV. Pt is out of town week of 7/23-7/28, but has f/u appt in [facility] on 02/13/11 for INR.

## 2011-02-09 ENCOUNTER — Ambulatory Visit: Payer: No Typology Code available for payment source | Admitting: Nurse Practitioner

## 2011-02-13 ENCOUNTER — Ambulatory Visit (INDEPENDENT_AMBULATORY_CARE_PROVIDER_SITE_OTHER): Payer: No Typology Code available for payment source | Admitting: *Deleted

## 2011-02-13 ENCOUNTER — Encounter: Payer: No Typology Code available for payment source | Admitting: *Deleted

## 2011-02-13 DIAGNOSIS — I4891 Unspecified atrial fibrillation: Secondary | ICD-10-CM

## 2011-02-13 NOTE — Telephone Encounter (Signed)
I reviewed with Dr Shirlee Latch. Pt has appt with Sunday Spillers 02/14/11. I talked with pt.  Pt will discuss scheduling cardioversion at the time of the appt with Norma Fredrickson, NP.

## 2011-02-14 ENCOUNTER — Ambulatory Visit (INDEPENDENT_AMBULATORY_CARE_PROVIDER_SITE_OTHER): Payer: No Typology Code available for payment source | Admitting: Nurse Practitioner

## 2011-02-14 ENCOUNTER — Encounter: Payer: Self-pay | Admitting: Nurse Practitioner

## 2011-02-14 DIAGNOSIS — Z01818 Encounter for other preprocedural examination: Secondary | ICD-10-CM

## 2011-02-14 DIAGNOSIS — I38 Endocarditis, valve unspecified: Secondary | ICD-10-CM | POA: Insufficient documentation

## 2011-02-14 DIAGNOSIS — I4891 Unspecified atrial fibrillation: Secondary | ICD-10-CM

## 2011-02-14 DIAGNOSIS — Z7901 Long term (current) use of anticoagulants: Secondary | ICD-10-CM

## 2011-02-14 LAB — CBC WITH DIFFERENTIAL/PLATELET
Basophils Absolute: 0 10*3/uL (ref 0.0–0.1)
Basophils Relative: 0.6 % (ref 0.0–3.0)
Eosinophils Absolute: 0.4 10*3/uL (ref 0.0–0.7)
Eosinophils Relative: 6.3 % — ABNORMAL HIGH (ref 0.0–5.0)
HCT: 41.8 % (ref 39.0–52.0)
Hemoglobin: 13.9 g/dL (ref 13.0–17.0)
Lymphocytes Relative: 21.9 % (ref 12.0–46.0)
Lymphs Abs: 1.4 10*3/uL (ref 0.7–4.0)
MCHC: 33.2 g/dL (ref 30.0–36.0)
MCV: 98.8 fl (ref 78.0–100.0)
Monocytes Absolute: 0.6 10*3/uL (ref 0.1–1.0)
Monocytes Relative: 8.9 % (ref 3.0–12.0)
Neutro Abs: 4 10*3/uL (ref 1.4–7.7)
Neutrophils Relative %: 62.3 % (ref 43.0–77.0)
Platelets: 133 10*3/uL — ABNORMAL LOW (ref 150.0–400.0)
RBC: 4.23 Mil/uL (ref 4.22–5.81)
RDW: 13.7 % (ref 11.5–14.6)
WBC: 6.5 10*3/uL (ref 4.5–10.5)

## 2011-02-14 LAB — BASIC METABOLIC PANEL
BUN: 16 mg/dL (ref 6–23)
CO2: 28 mEq/L (ref 19–32)
Calcium: 8.3 mg/dL — ABNORMAL LOW (ref 8.4–10.5)
Chloride: 105 mEq/L (ref 96–112)
Creatinine, Ser: 1.1 mg/dL (ref 0.4–1.5)
GFR: 70.91 mL/min (ref >60.00)
Glucose, Bld: 123 mg/dL — ABNORMAL HIGH (ref 70–99)
Potassium: 4.3 mEq/L (ref 3.5–5.1)
Sodium: 139 mEq/L (ref 135–145)

## 2011-02-14 LAB — APTT: aPTT: 45.2 s — ABNORMAL HIGH (ref 21.7–28.8)

## 2011-02-14 NOTE — Assessment & Plan Note (Signed)
INR's have been therapeutic. No adverse effects noted.

## 2011-02-14 NOTE — Assessment & Plan Note (Signed)
He remains in atrial fib. His rate is controlled. He has been sufficiently anticoagulated with coumadin since the end of June. Will proceed with cardioversion this week with Dr. Shirlee Latch. It is scheduled for Thursday August 2nd at [place]. [facility] #[id number]

## 2011-02-14 NOTE — Assessment & Plan Note (Signed)
Has moderate to severe MR and now with moderate AI. EF is 50 to 55%. Will need close follow up. Will try to restore sinus rhythm. Further disposition per Dr. Shirlee Latch. I will have Dr. Shirlee Latch see him in one month. Patient is agreeable to this plan and will call if any problems develop in the interim.

## 2011-02-14 NOTE — Progress Notes (Signed)
[name] Date of Birth: 1945/10/13   History of Present Illness: [name] is seen back today for a follow up visit. He is seen for Dr. Shirlee Latch. He is a former patient of Dr. Ronnald Nian. He has recurrent atrial fib and has been back on coumadin. His INR's have been therapeutic since June 28th. He has just returned from a one week bus trip. He had some shortness of breath and is easily fatigued. His echo now shows moderate to severe MR and moderate AI. His EF was 50 to 55%. He will need continued follow up. He is ready to proceed with cardioversion.   Current Outpatient Prescriptions on File Prior to Visit  Medication Sig Dispense Refill  . allopurinol (ZYLOPRIM) 300 MG tablet Take 300 mg by mouth daily.       Marland Kitchen ALPRAZolam (XANAX) 1 MG tablet take 1 tablet by mouth once daily  90 tablet  1  . aspirin 81 MG tablet Take 81 mg by mouth daily.        . colchicine (COLCRYS) 0.6 MG tablet Take 0.6 mg by mouth 4 (four) times daily as needed. Gout pain       . diltiazem (CARDIZEM CD) 240 MG 24 hr capsule Take 240 mg by mouth daily.        Marland Kitchen escitalopram (LEXAPRO) 20 MG tablet Take 1 tablet (20 mg total) by mouth daily.  90 tablet  6  . fexofenadine (ALLEGRA) 180 MG tablet Take 1 tablet (180 mg total) by mouth daily.  60 tablet  6  . furosemide (LASIX) 20 MG tablet Take 20 mg by mouth. Monday - Wednesday-Friday       . Melatonin 3 MG CAPS Take by mouth at bedtime.        . multivitamin (THERAGRAN) per tablet Take 1 tablet by mouth daily.        Marland Kitchen NIASPAN 500 MG CR tablet take 2 tablets by mouth once daily  180 tablet  3  . NON FORMULARY CPAP       . Omega-3 Fatty Acids (FISH OIL) 1000 MG CAPS Take 1 capsule by mouth 2 (two) times daily.        Marland Kitchen omeprazole (PRILOSEC) 40 MG capsule Take 40 mg by mouth daily.        . rosuvastatin (CRESTOR) 10 MG tablet Take 10 mg by mouth daily.        . Tamsulosin HCl (FLOMAX) 0.4 MG CAPS Take 1 capsule (0.4 mg total) by mouth daily after supper.  60 capsule  4  .  warfarin (COUMADIN) 5 MG tablet Take 5 mg by mouth as directed.         No Known Allergies  Past Medical History  Diagnosis Date  . ALLERGIC RHINITIS 12/02/2009  . ANXIETY 06/17/2007  . Atrial fibrillation 12/30/2009    s/p cardioversion  . DEPRESSION 06/17/2007  . DIVERTICULOSIS, COLON 02/11/2006  . DYSPNEA ON EXERTION 12/20/2009  . ENDOCARDITIS July 2002  . GASTROINTESTINAL HEMORRHAGE, HX OF     Remote history  . GERD 07/16/2007  . GOUT 06/17/2007  . HYPERCHOLESTEROLEMIA 06/17/2007  . HYPERTENSION 06/17/2007  . HYPOGONADISM 05/18/2009  . Impaired glucose tolerance 09/01/2010  . MITRAL VALVE PROLAPSE 06/17/2007  . OBSTRUCTIVE SLEEP APNEA 07/29/2007  . RESTLESS LEGS SYNDROME 07/29/2007  . 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 URI 06/08/2009    Past Surgical History  Procedure Date  . Appendectomy   . Vasectomy   . Septoplasty   . Cardioversion  July 2011  . US echocardiography June 2011    Mild to moderate MR with MVP, TR, moderate biatrial enlargement  . Nuclear stress test Dec 2009    Normal    History  Smoking status  . Former Smoker -- 1.0 packs/day for 20 years  . Types: Cigarettes  . Quit date: 07/17/1978  Smokeless tobacco  . Never Used    History  Alcohol Use  . 3.5 oz/week  . 7 drink(s) per week    Family History  Problem Relation Age of Onset  . Heart disease Mother   . Breast cancer Mother   . Stroke Father     Review of Systems: The review of systems is positive for DOE and fatigue. No adverse effects from coumadin. No dizziness.  All other systems were reviewed and are negative.  Physical Exam: BP 142/64  Pulse 76  Ht 6\' 3"  (1.905 m)  Wt 251 lb 3.2 oz (113.944 kg)  BMI 31.40 kg/m2 Patient is pleasant and in no acute distress. Skin is warm and dry. Color is normal.  HEENT is unremarkable. Normocephalic/atraumatic. PERRL. Sclera are nonicteric. Neck is supple. No masses. No JVD. Lungs are clear. Cardiac exam shows an irregular rhythm. Rate is  controlled. Abdomen is obese and soft. Extremities are without edema. Gait and ROM are intact. No gross neurologic deficits noted.  LABORATORY DATA: EKG shows atrial fib. Rate is 76.   Assessment / Plan:

## 2011-02-14 NOTE — Patient Instructions (Addendum)
Stay on your current medicines We are going to set up a cardioversion for this Thursday August 2nd at [facility] with Dr. Shirlee Latch  Show up at [facility] 2nd floor Short Stay at 7AM No food or drink after midnite on Wednesday You may take your meds that morning with a sip of water Someone must drive you home.    Electrical Cardioversion Cardioversion is the delivery of a jolt of electricity to change the rhythm of the heart. Sticky patches or metal paddles are placed on the chest to deliver the electricity from a special device. This is done to restore a normal rhythm. A rhythm that is too fast or not regular keeps the heart from pumping well. Compared to medicines used to change an abnormal rhythm, cardioversion is faster and works better. It is also unpleasant and may dislodge blood clots from the heart. WHEN WOULD THIS BE DONE?  In an emergency:   There is low or no blood pressure as a result of the heart rhythm.   Normal rhythm must be restored as fast as possible to protect the brain and heart from further damage.   It may save a life.   For less serious heart rhythms, such as atrial fibrillation or flutter, in which:   The heart is beating too fast or is not regular.   The heart is still able to pump enough blood, but not as well as it should.   Medicine to change the rhythm has not worked.   It is safe to wait in order to allow time for preparation.  BEFORE THE PROCEDURE  You may have some tests to see how well your heart is working.   You may start taking blood thinners so your blood does not clot as easily.   Other drugs may be given to help your heart work better.  LET YOUR CAREGIVER KNOW ABOUT:  Every medicine you are taking. It is very important to do this! Know when to take or stop taking any of them.   Any time in the past that you have felt your heart was not beating normally.  RISKS AND COMPLICATIONS  Clots may form in the chambers of the heart if it is beating  too fast. These clots may be dislodged during the procedure and travel to other parts of the body.   There is risk of a stroke during and after the procedure if a clot moves. Blood thinners lower this risk.   You may have a special test of your heart (TEE) to make sure there are no clots in your heart.  PROCEDURE (SCHEDULED)  The procedure is typically done in a hospital by a heart doctor (cardiologist).   You will be told when and where to go.   You may be given some medicine through an intravenous (IV) access to reduce discomfort and make you sleepy before the procedure.   Your whole body may move when the shock is delivered. Your chest may feel sore.   You may be able to go home after a few hours. Your heart rhythm will be watched to make sure it does not change.  HOME CARE INSTRUCTIONS  Only take medicine as directed by your caregiver. Be sure you understand how and when to take your medicine.   Learn how to feel your pulse and check it often.   Limit your activity for 48 hours.   Avoid caffeine and other stimulants as directed.  SEEK MEDICAL CARE IF:  You feel like  your heart is beating too fast or your pulse is not regular.   You have any questions about your medicines.   You have bleeding that will not stop.  SEEK IMMEDIATE MEDICAL CARE IF:  You are dizzy or feel faint.   It is hard to breathe or you feel short of breath.   There is a change in discomfort in your chest.   Your speech is slurred or you have trouble moving your arm or leg on one side.   You get a muscle cramp.   Your fingers or toes turn cold or blue.  MAKE SURE YOU:  Understand these instructions.   Will watch your condition.   Will get help right away if you are not doing well or get worse.  Document Released: 06/23/2002 Document Re-Released: 09/27/2009 [facility] Patient Information 2011 [place], [place].

## 2011-02-16 ENCOUNTER — Ambulatory Visit (HOSPITAL_COMMUNITY)
Admission: RE | Admit: 2011-02-16 | Discharge: 2011-02-16 | Disposition: A | Discharge status: Home / Self Care | Payer: No Typology Code available for payment source | Source: Ambulatory Visit | Attending: Internal Medicine | Admitting: Internal Medicine

## 2011-02-16 DIAGNOSIS — G4733 Obstructive sleep apnea (adult) (pediatric): Secondary | ICD-10-CM | POA: Insufficient documentation

## 2011-02-16 DIAGNOSIS — I10 Essential (primary) hypertension: Secondary | ICD-10-CM | POA: Insufficient documentation

## 2011-02-16 DIAGNOSIS — I4891 Unspecified atrial fibrillation: Secondary | ICD-10-CM | POA: Insufficient documentation

## 2011-02-16 LAB — APTT: aPTT: 39 seconds — ABNORMAL HIGH (ref 24–37)

## 2011-02-20 ENCOUNTER — Encounter: Payer: No Typology Code available for payment source | Admitting: *Deleted

## 2011-02-20 NOTE — Progress Notes (Signed)
Agree with note.  Will need to see in clinic soon.  [name] [employer]

## 2011-02-21 ENCOUNTER — Encounter: Payer: Self-pay | Admitting: Nurse Practitioner

## 2011-02-22 ENCOUNTER — Ambulatory Visit (INDEPENDENT_AMBULATORY_CARE_PROVIDER_SITE_OTHER): Payer: No Typology Code available for payment source | Admitting: *Deleted

## 2011-02-22 DIAGNOSIS — I4891 Unspecified atrial fibrillation: Secondary | ICD-10-CM

## 2011-02-22 LAB — POCT INR: INR: 2.3

## 2011-02-24 NOTE — Op Note (Signed)
  [name], [name]              ACCOUNT NO.:  [id number]  MEDICAL RECORD NO.:  [id number]  LOCATION:  [facility]                         FACILITY:  [facility]  PHYSICIAN:  Doylene Canning. Ladona Ridgel, MD    DATE OF BIRTH:  [birth year]  DATE OF PROCEDURE:  02/16/2011 DATE OF DISCHARGE:                              OPERATIVE REPORT   PROCEDURE PERFORMED:  Direct-current cardioversion.  INDICATION:  Symptomatic atrial fibrillation.  INSTRUCTION:  The patient is a 65 year old male with a history of recurrent atrial fibrillation which has been persistent.  He has been on chronic Coumadin therapy with therapeutic INRs.  He is now referred for DC cardioversion.  PROCEDURE:  After informed consent was obtained, the patient was prepped in usual manner.  The electrode dispersive pad was placed in the anteroposterior position.  A 120 mg of propofol was delivered under the direction of Dr. Ivin Booty with the Anesthesia Service.  After adequate sedation, 200 joules of synchronized biphasic energy was applied by the electrode dispersive pad resulting in restoration of sinus rhythm.  The patient tolerated the procedure well and was allowed to awaken in the usual manner.  COMPLICATIONS:  There were no immediate procedure complications.  RESULTS:  Demonstrate successful DC cardioversion with the patient was symptomatic AFib.     Doylene Canning. Ladona Ridgel, MD     GWT/MEDQ  D:  02/16/2011  T:  02/16/2011  Job:  [id number]  cc:   Dante Gang, N.P. Marca Ancona, MD  Electronically Signed by Lewayne Bunting MD on 02/24/2011 09:53:36 AM

## 2011-02-28 ENCOUNTER — Encounter: Payer: No Typology Code available for payment source | Admitting: *Deleted

## 2011-03-07 ENCOUNTER — Encounter: Payer: Self-pay | Admitting: *Deleted

## 2011-03-09 ENCOUNTER — Ambulatory Visit (INDEPENDENT_AMBULATORY_CARE_PROVIDER_SITE_OTHER): Payer: No Typology Code available for payment source | Admitting: *Deleted

## 2011-03-09 ENCOUNTER — Encounter: Payer: Self-pay | Admitting: *Deleted

## 2011-03-09 ENCOUNTER — Encounter: Payer: Self-pay | Admitting: Cardiology

## 2011-03-09 ENCOUNTER — Ambulatory Visit (INDEPENDENT_AMBULATORY_CARE_PROVIDER_SITE_OTHER): Payer: No Typology Code available for payment source | Admitting: Cardiology

## 2011-03-09 DIAGNOSIS — E7800 Pure hypercholesterolemia, unspecified: Secondary | ICD-10-CM

## 2011-03-09 DIAGNOSIS — I4891 Unspecified atrial fibrillation: Secondary | ICD-10-CM

## 2011-03-09 DIAGNOSIS — I059 Rheumatic mitral valve disease, unspecified: Secondary | ICD-10-CM

## 2011-03-09 DIAGNOSIS — I340 Nonrheumatic mitral (valve) insufficiency: Secondary | ICD-10-CM

## 2011-03-09 DIAGNOSIS — I38 Endocarditis, valve unspecified: Secondary | ICD-10-CM

## 2011-03-09 NOTE — Patient Instructions (Signed)
You are scheduled for a TEE at [facility] Monday August 27,2012. See instruction sheet given to you today.  Dr Shirlee Latch will let you know about a follow-up appt with him after the TEE has been done.

## 2011-03-09 NOTE — Progress Notes (Signed)
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  He has been seen by Dr. Deborah Chalk in the past and is seen by me for the first time today.  He had Strep bovis mitral valve endocarditis in 2002.  He had pre-existing mitral valve prolapse.  He has had mild to moderate MR in the past.  Most recent echo in 7/12 showed eccentric, at least moderate but cannot rule out severe mitral regurgitation.  LV systolic function was preserved.    Patient has a history of paroxysmal atrial fibrillation.  He had DCCV to NSR in 7/11 and re-developed atrial fibrillation a few months ago.  Repeat cardioversion earlier this month was initially successful but he is back in atrial fibrillation today.  Over the last 1.5 months while in atrial fibrillation, he has noted dyspnea with more heavy exertion like walking up hills or stairs.  He does not get short of breath walking on flat ground and can play golf without trouble. He gets occasional atypical chest pain that tends to occur after meals and is consistent with GERD.  Weight is down 6 lbs since last appointment.    Patient's daughter is due to give birth to triplets in October.    Labs (8/12): K 4.3, creatinine 1.1, HCT 41.8  ECG: Atrial fibrillation at 84  PMH: 1. 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   2. Mitral valve prolapse with mitral regurgitation.  Most recent echo was in 7/12: EF 55% with borderline LV dilation, mild to moderate aortic insufficiency and eccentric at least moderate/possibly severe mitral regurgitation, RV mildly dilated, PA systolic pressure 41 mmHg, moderate biatrial enlargement.   3. HTN 4. Hyperlipidemia 5. Gout 6. OSA 7. Mild depression 8. Atrial fibrillation: Initial episode in 2011 with DCCV in 7/11, recurrence in 2012 with DCCV in 8/12, at appointment on 03/09/11 atrial fibrillation had recurred.   SH: Married, lives  in [place].  Working.  1 ETOH drink/day average.  Quit smoking in 1980.   FH: Mother with "heart disease," father with CVA  ROS: All systems reviewed and negative except as per HPI.   Current Outpatient Prescriptions  Medication Sig Dispense Refill  . allopurinol (ZYLOPRIM) 300 MG tablet Take 300 mg by mouth daily.       Marland Kitchen ALPRAZolam (XANAX) 1 MG tablet take 1 tablet by mouth once daily  90 tablet  1  . aspirin 81 MG tablet Take 81 mg by mouth daily.        . colchicine (COLCRYS) 0.6 MG tablet Take 0.6 mg by mouth 4 (four) times daily as needed. Gout pain       . diltiazem (CARDIZEM CD) 240 MG 24 hr capsule Take 240 mg by mouth daily.        Marland Kitchen escitalopram (LEXAPRO) 20 MG tablet Take 1 tablet (20 mg total) by mouth daily.  90 tablet  6  . fexofenadine (ALLEGRA) 180 MG tablet Take 1 tablet (180 mg total) by mouth daily.  60 tablet  6  . furosemide (LASIX) 20 MG tablet Take 20 mg by mouth. Monday - Wednesday-Friday       . Melatonin 3 MG CAPS Take by mouth at bedtime.        . multivitamin (THERAGRAN) per tablet Take 1 tablet by mouth daily.        Marland Kitchen NIASPAN 500 MG CR tablet take 2 tablets by mouth once daily  180 tablet  3  . NON FORMULARY CPAP       . Omega-3 Fatty Acids (FISH OIL) 1000 MG CAPS Take 1 capsule by mouth 2 (two) times daily.        Marland Kitchen omeprazole (PRILOSEC) 40 MG capsule Take 40 mg by mouth daily.        . rosuvastatin (CRESTOR) 10 MG tablet Take 10 mg by mouth daily.        . Tamsulosin HCl (FLOMAX) 0.4 MG CAPS Take 1 capsule (0.4 mg total) by mouth daily after supper.  60 capsule  4  . warfarin (COUMADIN) 5 MG tablet Take 5 mg by mouth as directed.         BP 122/66  Pulse 83  Ht 6\' 3"  (1.905 m)  Wt 245 lb 6.4 oz (111.313 kg)  BMI 30.67 kg/m2 General: NAD, overweight Neck: No JVD, no thyromegaly or thyroid nodule.  Lungs: Clear to auscultation bilaterally with normal respiratory effort. CV: Nondisplaced PMI.  Heart regular S1/S2, no S3/S4, 3/6 HSM at apex.  Trace  ankle edema.  No carotid bruit.  Normal pedal pulses.  Abdomen: Soft, nontender, no hepatosplenomegaly, no distention.  Neurologic: Alert and oriented x 3.  Psych: Normal affect. Extremities: No clubbing or cyanosis.

## 2011-03-10 NOTE — Assessment & Plan Note (Signed)
Patient has mitral valve prolapse with history of mitral endocarditis.  Most recent echo shows eccentric moderate to possibly severe mitral regurgitation, which appears to be progressive compared to the prior echo.  The left ventricle is upper normal in size.  EF is about 55%.  He has NYHA class II symptoms with dyspnea with heavy exertion and atrial fibrillation.  If mitral regurgitation is indeed severe, the new onset atrial fibrillation and mild CHF symptoms would be an indication for mitral valve repair.  However, it is difficult given the eccentric MR jet to determine if the regurgitation is truly severe.   - TEE to assess MR severity.  This will also allow assessment of the severity of the aortic insufficiency.

## 2011-03-10 NOTE — Assessment & Plan Note (Addendum)
Will need to set him up for lipids/LFTs at next appointment.   I spent > 40 minutes with patient and reviewing records and echo.

## 2011-03-10 NOTE — Assessment & Plan Note (Signed)
Atrial fibrillation has recurred fairly rapidly after cardioversion.  This is likely due to atrial enlargement in the setting of significant mitral regurgitation.  I do not think that he will be likely to hold sinus rhythm in the face of this mitral regurgitation without an antiarrhythmic medication.  I will more closely assess the MR with a TEE.  If MR is severe and I recommend repair, MAZE can be done at the time of the repair with ligation of the LAA appendage.  If MR is not severe, I would consider putting him on an antiarrhythmic medication such as dofetilide and re-attempting cardioversion.  He is symptomatic with atrial fibrillation.  Continue rate control with diltiazem CD and anticoagulation with warfarin.  Patient would be a candidate for Pradaxa or Xarelto in the future.  Creatinine clearance is normal.

## 2011-03-13 ENCOUNTER — Ambulatory Visit (HOSPITAL_COMMUNITY)
Admission: RE | Admit: 2011-03-13 | Discharge: 2011-03-13 | Disposition: A | Discharge status: Home / Self Care | Payer: No Typology Code available for payment source | Source: Ambulatory Visit | Attending: Cardiology | Admitting: Cardiology

## 2011-03-13 ENCOUNTER — Telehealth: Payer: Self-pay | Admitting: *Deleted

## 2011-03-13 ENCOUNTER — Encounter: Payer: Self-pay | Admitting: *Deleted

## 2011-03-13 DIAGNOSIS — I059 Rheumatic mitral valve disease, unspecified: Secondary | ICD-10-CM | POA: Insufficient documentation

## 2011-03-13 DIAGNOSIS — I340 Nonrheumatic mitral (valve) insufficiency: Secondary | ICD-10-CM

## 2011-03-13 DIAGNOSIS — I079 Rheumatic tricuspid valve disease, unspecified: Secondary | ICD-10-CM | POA: Insufficient documentation

## 2011-03-13 DIAGNOSIS — I359 Nonrheumatic aortic valve disorder, unspecified: Secondary | ICD-10-CM | POA: Insufficient documentation

## 2011-03-13 NOTE — Telephone Encounter (Signed)
Per Dr Daniel Nones last dose of coumadin Thursday Sept 6,2012. Pt is aware.

## 2011-03-13 NOTE — Telephone Encounter (Signed)
Per Dr Adrian Prows right and left heart cath 03/28/11 mitral regurgitation prior to possible MVR. Pt to take last dose of coumadin on Friday September 7. Pt to come for BMP/CBC/PT 03/27/11.

## 2011-03-21 ENCOUNTER — Telehealth: Payer: Self-pay | Admitting: Cardiology

## 2011-03-21 NOTE — Telephone Encounter (Signed)
Patient calling has general questions.

## 2011-03-21 NOTE — Telephone Encounter (Signed)
LMTCB

## 2011-03-23 NOTE — Telephone Encounter (Signed)
I talked with pt. I answered his questions about cath scheduled for 03/28/11.

## 2011-03-23 NOTE — Telephone Encounter (Signed)
Returning call back to nurse.  

## 2011-03-27 ENCOUNTER — Other Ambulatory Visit (INDEPENDENT_AMBULATORY_CARE_PROVIDER_SITE_OTHER): Payer: No Typology Code available for payment source | Admitting: *Deleted

## 2011-03-27 ENCOUNTER — Encounter: Payer: No Typology Code available for payment source | Admitting: *Deleted

## 2011-03-27 DIAGNOSIS — I340 Nonrheumatic mitral (valve) insufficiency: Secondary | ICD-10-CM

## 2011-03-27 DIAGNOSIS — I059 Rheumatic mitral valve disease, unspecified: Secondary | ICD-10-CM

## 2011-03-27 LAB — PROTIME-INR
INR: 1.3 ratio — ABNORMAL HIGH (ref 0.8–1.0)
Prothrombin Time: 14.5 s — ABNORMAL HIGH (ref 10.2–12.4)

## 2011-03-27 LAB — CBC WITH DIFFERENTIAL/PLATELET
Basophils Absolute: 0 10*3/uL (ref 0.0–0.1)
Eosinophils Absolute: 0.4 10*3/uL (ref 0.0–0.7)
Eosinophils Relative: 4.5 % (ref 0.0–5.0)
MCV: 98.1 fl (ref 78.0–100.0)
Monocytes Absolute: 0.7 10*3/uL (ref 0.1–1.0)
Neutrophils Relative %: 66.5 % (ref 43.0–77.0)
Platelets: 129 10*3/uL — ABNORMAL LOW (ref 150.0–400.0)
RDW: 14.2 % (ref 11.5–14.6)
WBC: 7.9 10*3/uL (ref 4.5–10.5)

## 2011-03-27 LAB — BASIC METABOLIC PANEL
BUN: 18 mg/dL (ref 6–23)
Chloride: 107 mEq/L (ref 96–112)
Creatinine, Ser: 0.9 mg/dL (ref 0.4–1.5)
GFR: 86.94 mL/min (ref >60.00)
Glucose, Bld: 107 mg/dL — ABNORMAL HIGH (ref 70–99)

## 2011-03-28 ENCOUNTER — Inpatient Hospital Stay (HOSPITAL_BASED_OUTPATIENT_CLINIC_OR_DEPARTMENT_OTHER)
Admission: RE | Admit: 2011-03-28 | Discharge: 2011-03-28 | Disposition: A | Discharge status: Home / Self Care | Payer: No Typology Code available for payment source | Source: Ambulatory Visit | Attending: Cardiology | Admitting: Cardiology

## 2011-03-28 DIAGNOSIS — I059 Rheumatic mitral valve disease, unspecified: Secondary | ICD-10-CM | POA: Insufficient documentation

## 2011-03-28 LAB — POCT I-STAT 3, ART BLOOD GAS (G3+)
Acid-base deficit: 1 mmol/L (ref 0.0–2.0)
O2 Saturation: 93 %
pO2, Arterial: 69 mmHg — ABNORMAL LOW (ref 80.0–100.0)

## 2011-03-28 LAB — POCT I-STAT 3, VENOUS BLOOD GAS (G3P V)
pCO2, Ven: 45.5 mmHg (ref 45.0–50.0)
pH, Ven: 7.356 — ABNORMAL HIGH (ref 7.250–7.300)
pO2, Ven: 35 mmHg (ref 30.0–45.0)

## 2011-03-29 ENCOUNTER — Institutional Professional Consult (permissible substitution) (INDEPENDENT_AMBULATORY_CARE_PROVIDER_SITE_OTHER): Payer: No Typology Code available for payment source | Admitting: Thoracic Surgery (Cardiothoracic Vascular Surgery)

## 2011-03-29 ENCOUNTER — Encounter: Payer: Self-pay | Admitting: Thoracic Surgery (Cardiothoracic Vascular Surgery)

## 2011-03-29 VITALS — BP 119/76 | HR 80 | Resp 20 | Ht 75.0 in | Wt 245.0 lb

## 2011-03-29 DIAGNOSIS — I4891 Unspecified atrial fibrillation: Secondary | ICD-10-CM

## 2011-03-29 DIAGNOSIS — I059 Rheumatic mitral valve disease, unspecified: Secondary | ICD-10-CM

## 2011-03-29 MED ORDER — AMIODARONE HCL 200 MG PO TABS: 200.0000 mg | ORAL_TABLET | Freq: Two times a day (BID) | ORAL | Status: AC

## 2011-03-29 MED ORDER — AMIODARONE HCL 200 MG PO TABS: 200.0000 mg | ORAL_TABLET | Freq: Two times a day (BID) | ORAL | Status: DC

## 2011-03-29 NOTE — Progress Notes (Signed)
PCP is Rogelia Boga, MD Referring Provider is Marca Ancona, MD  Chief Complaint  Patient presents with  . Mitral Regurgitation    consultation for MINI MVR, cathed 03/28/11,    HPI: Patient is referred for evaluation of severe mitral regurgitation and atrial fibrillation. He was first told that he had a heart murmur many years ago when he was in the [employer]. He was diagnosed with mitral valve prolapse more than 10 years ago. He developed Strep bovis mitral valve endocarditis in 2002. Since then he has had at least moderate mitral regurgitation.  The patient developed atrial fibrillation in 2011. He underwent DC cardioversion remained in sinus rhythm for some time. He again developed atrial fibrillation associated with worsening shortness of breath this past spring. He underwent another DC cardioversion but again failed and return back into atrial fibrillation. Over this period of time the patient has developed worsening symptoms of exertional shortness of breath. He now get short of breath just walking up a flight of stairs. He does not get short of breath walking on flat ground, normal activity has not been substantially limited. He has not had any chest pain. He denies any resting shortness of breath, PND, orthopnea, palpitations, or syncope. He has had some mild intermittent lower extremity edema.  Followup echocardiogram was performed 02/02/2011. This confirmed the presence of severe mitral regurgitation. The patient subsequently underwent transesophageal echocardiogram on 03/13/2011. There was eccentric jet of severe mitral regurgitation directed posteriorly there was prolapse involving the posterior leaflet but there did not appear to be any flail segments. ERO calculated was 0.83 cm, corresponding to a very severe mitral regurgitation. Left atrium was dilated. Left ventricular systolic function was normal. There was mild aortic insufficiency and moderate tricuspid regurgitation.  The  patient subsequently underwent left and right heart catheterization yesterday. This revealed the presence of normal coronary artery anatomy with no significant coronary artery disease. There was moderate pulmonary hypertension with PA pressures measured 46/20 pulmonary capillary wedge pressure was 22 with large V waves to 34 mm mercury. Aortogram of the descending abdominal aorta and iliac vessels was normal. The patient has been referred for possible surgical intervention  Past Medical History  Diagnosis Date  . ALLERGIC RHINITIS 12/02/2009  . ANXIETY 06/17/2007  . Atrial fibrillation 12/30/2009    s/p cardioversion  . DEPRESSION 06/17/2007  . DIVERTICULOSIS, COLON 02/11/2006  . DYSPNEA ON EXERTION 12/20/2009  . ENDOCARDITIS July 2002  . GASTROINTESTINAL HEMORRHAGE, HX OF     Remote history  . GERD 07/16/2007  . GOUT 06/17/2007  . HYPERCHOLESTEROLEMIA 06/17/2007  . HYPERTENSION 06/17/2007  . HYPOGONADISM 05/18/2009  . Impaired glucose tolerance 09/01/2010  . MITRAL VALVE PROLAPSE 06/17/2007  . OBSTRUCTIVE SLEEP APNEA 07/29/2007  . RESTLESS LEGS SYNDROME 07/29/2007  . 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 URI 06/08/2009  . Atrial fibrillation   . Mitral regurgitation     Past Surgical History  Procedure Date  . Appendectomy   . Vasectomy   . Septoplasty   . Cardioversion July 2011  . US echocardiography June 2011    Mild to moderate MR with MVP, TR, moderate biatrial enlargement  . Nuclear stress test Dec 2009    Normal  . Fracture surgery     left humerus    Family History  Problem Relation Age of Onset  . Heart disease Mother   . Breast cancer Mother   . Stroke Father     Social History History  Substance Use Topics  . Smoking status:  Former Smoker -- 1.0 packs/day for 20 years    Types: Cigarettes    Quit date: 07/17/1978  . Smokeless tobacco: Never Used  . Alcohol Use: 3.5 oz/week    7 drink(s) per week    Current Outpatient Prescriptions  Medication Sig  Dispense Refill  . allopurinol (ZYLOPRIM) 300 MG tablet Take 300 mg by mouth daily.       Marland Kitchen ALPRAZolam (XANAX) 1 MG tablet take 1 tablet by mouth once daily  90 tablet  1  . aspirin 81 MG tablet Take 81 mg by mouth daily.        Marland Kitchen diltiazem (CARDIZEM CD) 240 MG 24 hr capsule Take 240 mg by mouth daily.        Marland Kitchen escitalopram (LEXAPRO) 20 MG tablet Take 1 tablet (20 mg total) by mouth daily.  90 tablet  6  . fexofenadine (ALLEGRA) 180 MG tablet Take 1 tablet (180 mg total) by mouth daily.  60 tablet  6  . furosemide (LASIX) 20 MG tablet Take 20 mg by mouth daily.       . Melatonin 3 MG CAPS Take by mouth at bedtime.        . multivitamin (THERAGRAN) per tablet Take 1 tablet by mouth daily.        Marland Kitchen NIASPAN 500 MG CR tablet take 2 tablets by mouth once daily  180 tablet  3  . NON FORMULARY CPAP       . Omega-3 Fatty Acids (FISH OIL) 1000 MG CAPS Take 1 capsule by mouth 2 (two) times daily.        Marland Kitchen omeprazole (PRILOSEC) 40 MG capsule Take 40 mg by mouth daily.        . rosuvastatin (CRESTOR) 10 MG tablet Take 10 mg by mouth daily.        . Tamsulosin HCl (FLOMAX) 0.4 MG CAPS Take 1 capsule (0.4 mg total) by mouth daily after supper.  60 capsule  4  . warfarin (COUMADIN) 5 MG tablet Take 5 mg by mouth as directed.       . colchicine (COLCRYS) 0.6 MG tablet Take 0.6 mg by mouth 4 (four) times daily as needed. Gout pain         No Known Allergies  Review of Systems: The patient reports normal appetite he has gained a few pounds recently. He reports some stable but worsening symptoms of exertional shortness of breath. He has not had resting shortness of breath, PND, orthopnea, palpitations, nor syncope. He has not had chest pain. He has an intermittent dry nonproductive cough. He denies productive cough, hemoptysis, wheezing. He has no difficulty swallowing. He does have intermittent symptoms of reflux. Bowel function is regular. He denies any history of hematochezia, hematemesis, melena. He has no  difficulty voiding urine. He denies symptoms suggestive of claudication although his legs to get tired with ambulation. He has not had much problem with arthritis or arthralgias. Neurologic the patient denies any symptoms suggestive of TIA or stroke. He has not had any seizures. He has not had problems with frequent headaches. He does admit to some tendency for depression. He has no problems with his dentition and he sees his dentist on a regular basis. He gets antibiotic prophylaxis before every dental exam. He has not had recent changes in his eyesight. The remainder of his review of systems is unremarkable.  BP 119/76  Pulse 80  Resp 20  Ht 6\' 3"  (1.905 m)  Wt 245 lb (111.131 kg)  BMI 30.62 kg/m2  SpO2 95% Physical Exam: On physical exam the patient is a relatively large moderately obese white male. Pulse is irregular. HEENT exam is unrevealing. The neck is supple. There is no jugular venous distension. There are no carotid bruits. There is no palpable lymphadenopathy. Auscultation of the chest demonstrates clear breath sounds which are symmetrical bilaterally. Cardiovascular exam includes irregular heart rhythm. There is a grade 3/6 systolic murmur heard best at the apex. No diastolic murmurs are noted. The abdomen is obese but soft nontender. Bowel sounds are present. Extremities are warm and well-perfused. Femoral pulses are palpable. Distal pulses are palpable in the posterior tibial and dorsalis pedis position. There is no sign of significant venous insufficiency. His skin is clean and dry and healthy appearing throughout. Rectal and GU exams are both deferred. Neurologic examination is nonfocal.  Diagnostic Tests: Both transthoracic and transesophageal echocardiograms are reviewed.  The patient has type II dysfunction with prolapse primarily involving the posterior leaflet of the mitral valve. However, there are no flail segments. There is a broad jet of regurgitation that is directed  posteriorly and fills the left atrium. This appears to her arise from [facility] near the A2/P2 region. Is unclear whether or not there might be a small leaflet perforation or flail knuckle of leaflet at A2. The anterior leaflet moves normally and otherwise without prolapse. The left atrium is dilated. There is mild aortic insufficiency. Left ventricular size and function appear normal. There is moderate tricuspid regurgitation. No other significant abnormalities are noted.  Left and right heart catheterization performed yesterday is also reviewed. Findings are as noted previously. In particular, there is no significant coronary artery disease. There is no sign of significant aortoiliac occlusive disease either.  Impression: Severe mitral regurgitation with mitral valve prolapse and history of bacterial endocarditis in the distant past. The patient has worsening symptoms of exertional shortness of breath and now persistent atrial fibrillation having failed DC cardioversion. I agree that elective mitral valve repair and maze procedure would be most appropriate. The patient appears to be a good candidate for use of minimally invasive approach.  Plan: I've discussed the indications, risks, and potential benefits of surgery at length with the patient and his wife. Alternative treatment strategies have been discussed. Expectations with continued medical therapy up in contrast with elective surgical intervention. Surgical alternatives have been discussed in detail and all their questions been addressed. We tentatively plan to proceed with surgery on September 25. The patient will continue Coumadin until September 19. At that time he will stop taking Coumadin and begin taking amiodarone. The patient and his wife both understand and accept all potential associated risks of surgery and desired to proceed as described. He understands that there is a very small chance that his valve might not be successfully  repairable, and under such circumstances we would plan to replace his valve with a mechanical prosthesis. However, based upon his transesophageal echocardiogram, I feel confident that his valve will likely be repairable.

## 2011-03-30 IMAGING — CR DG CHEST 2V
2 series · 2 of 2 positions shown · non-contrast
Comparison: 12/20/2009

CLINICAL DATA: Cough, chest pain, shortness of breath.  486.

CHEST - 2 VIEW

[view not recorded (1 of 2)]
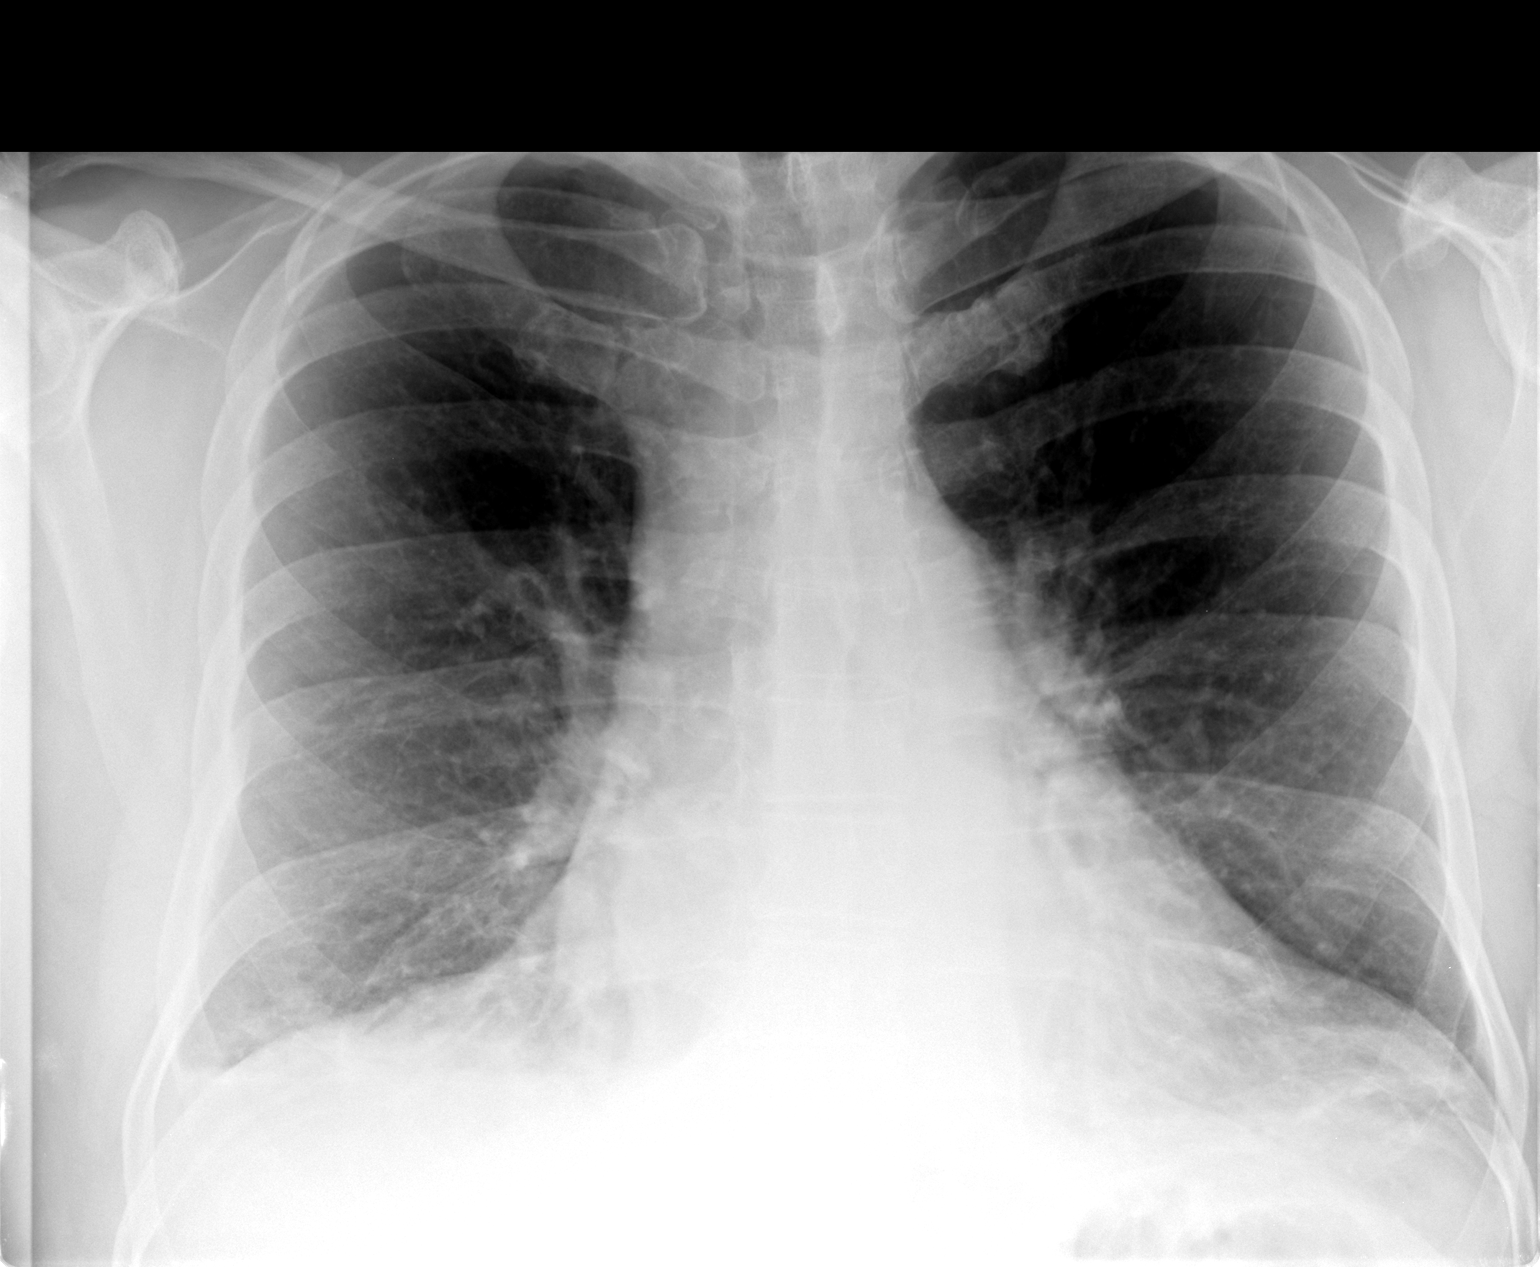

[view not recorded (2 of 2)]
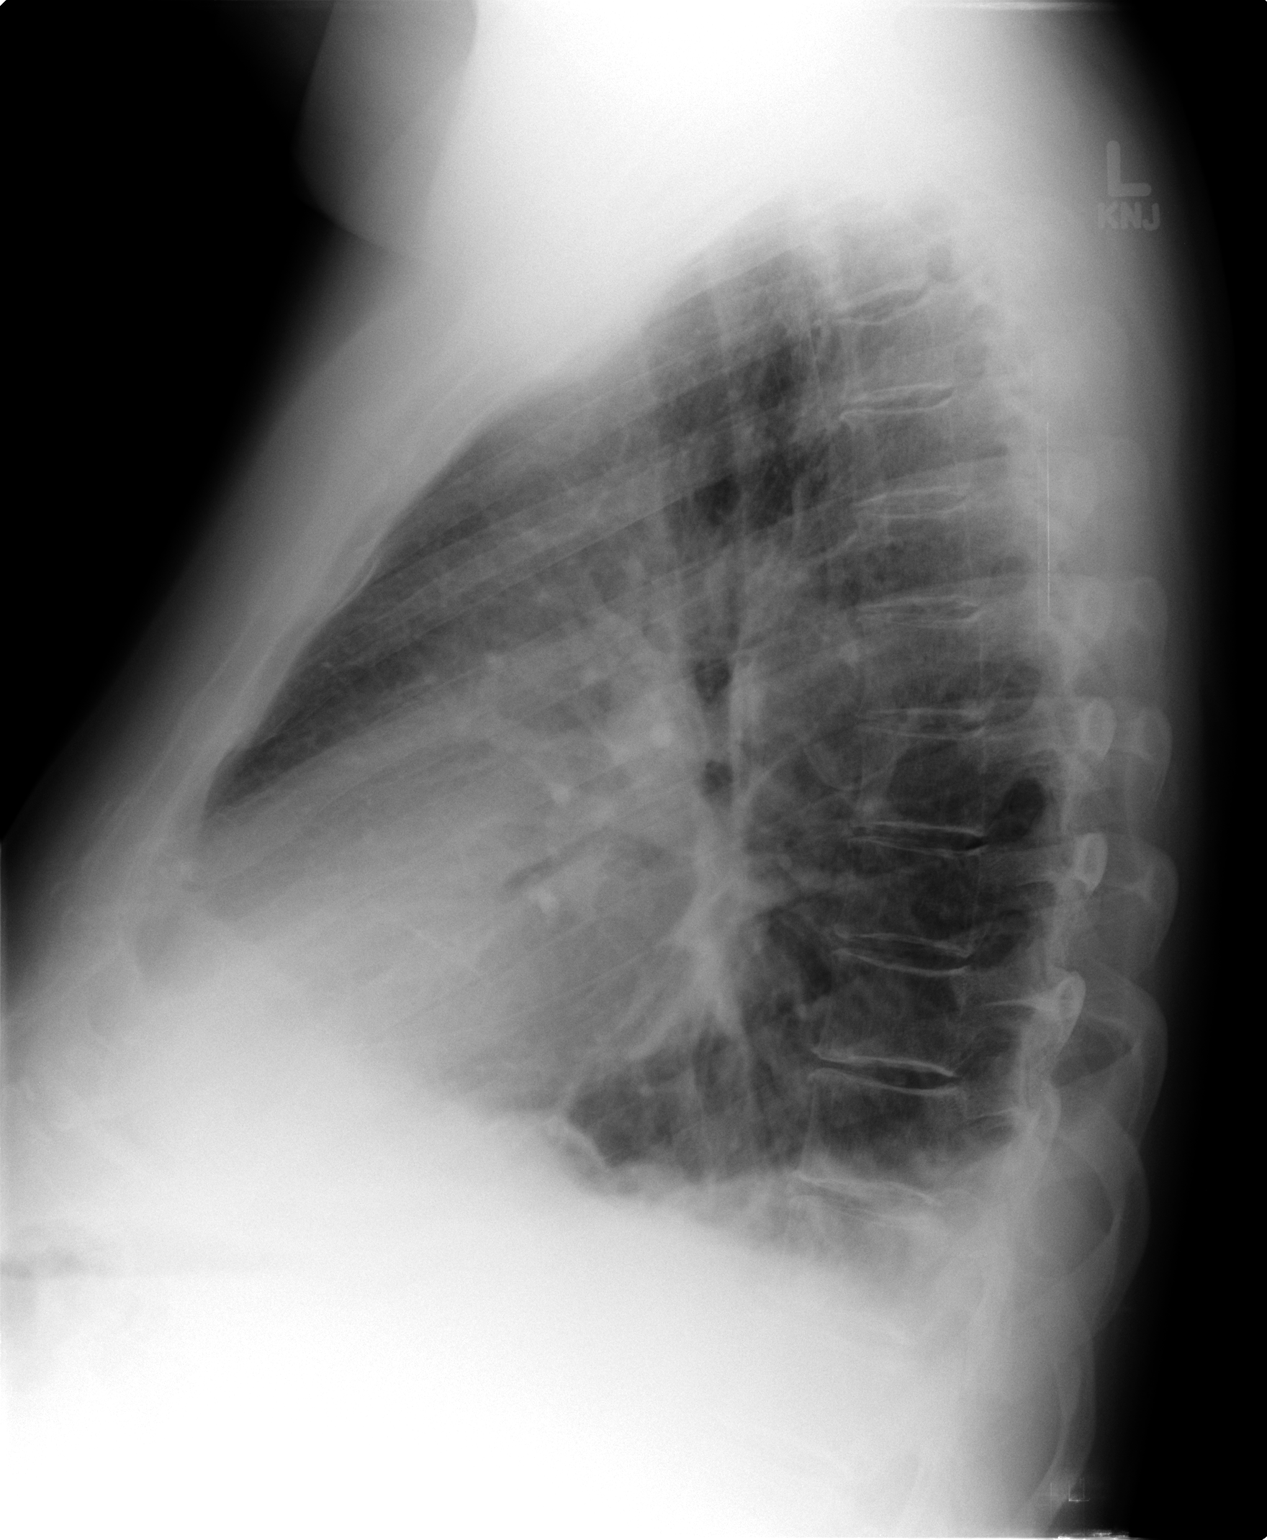

[2 of 2 positions shown; findings below may reference images not displayed]

FINDINGS: The patient has developed a small right pleural effusion
with increased infiltrate or atelectasis at the right base
posteriorly.  There is now new mild atelectasis at the left base.

Heart size and pulmonary vascularity remain normal.

No significant bony abnormalities.
IMPRESSION: 1.  Progressive small area of infiltrate/atelectasis at the right
lung base with a new small right effusion.
2.  New mild atelectasis at the left base.

## 2011-04-02 NOTE — Cardiovascular Report (Signed)
NAMEKEIR, VIERNES NO.:  [id number]  MEDICAL RECORD NO.:  [id number]  LOCATION:                                 FACILITY:  PHYSICIAN:  Marca Ancona, MD      DATE OF BIRTH:  1945-08-04  DATE OF PROCEDURE: DATE OF DISCHARGE:                           CARDIAC CATHETERIZATION   PROCEDURES PERFORMED: 1. Left heart catheterization. 2. Right heart catheterization. 3. Coronary angiography 4. Left ventriculography.  INDICATIONS:  This is a 65 year old who has been found to have eccentric mitral regurgitation and we are doing a right and left heart catheterization preoperatively prior to possible aortic and mitral valve repair.  PROCEDURE NOTE:  After informed consent was obtained, the left groin was sterilely prepped and draped.  1% lidocaine was used to locally anesthetize the left groin area.  The left common femoral vein was entered using modified Seldinger technique and 7-French venous catheter was placed.  The left common femoral artery was entered using modified Seldinger technique and a 4-French arterial sheath was placed.  The right heart catheterization was carried out using balloon-tip Swan-Ganz catheter.  The left heart catheterization was next done.  The left coronary artery was engaged using the JL-4 catheter.  Right coronary artery was engaged using  3DRC catheter.  Left ventriculography was done using the pigtail catheter as was descending aortography.  FINDINGS: 1. Hemodynamics:  Mean right atrial pressure 13 mmHg, RV 40/15, PA     46/20 with mean PA pressure 32 mmHg, mean pulmonary capillary wedge     pressure 22 mmHg with a V-wave to 34 mmHg, LV 121/19, aorta 116/73,     aortic saturation 93%, PA saturation 63% cardiac output 5.1 liters     per minute, cardiac index 2.1. 2. Left ventriculography:  Left ventriculography was difficult to     interpret given a run of ventricular tachycardia during the     injection; however, EF was estimated  to be robust at 60-65%.  There     was at least 3+, possibly 4+ mitral regurgitation. 3. Descending aortogram:  The iliac system had no significant disease. 4. Right coronary artery:  The right coronary artery was a dominant     vessel.  No angiographic coronary disease. 5. Left main:  The left main had no angiographic coronary disease. 6. Circumflex system:  There is a moderate-sized ramus, as well as 2     moderate obtuse marginals.  There is no angiographic coronary     disease. 7. LAD system:  There is no angiographic disease.  IMPRESSION:  The patient has no angiographic coronary disease.  There are mildly elevated right and left heart filling pressures.  The patient has a very prominent V-wave in his wedge tracing.  Mitral regurgitation looks probably severe on the left ventriculogram.  I suspect the patient does have eccentric severe mitral regurgitation.  I will refer the patient for minimally invasive mitral valve repair.     Marca Ancona, MD     DM/MEDQ  D:  03/28/2011  T:  03/28/2011  Job:  [id number]  cc:   Salvatore Decent. [name], M.D. [name], MD  Electronically Signed by Marca Ancona MD  on 04/02/2011 12:06:10 AM

## 2011-04-03 ENCOUNTER — Encounter: Payer: No Typology Code available for payment source | Admitting: *Deleted

## 2011-04-04 ENCOUNTER — Encounter: Payer: No Typology Code available for payment source | Admitting: *Deleted

## 2011-04-07 ENCOUNTER — Ambulatory Visit (HOSPITAL_COMMUNITY)
Admission: RE | Admit: 2011-04-07 | Discharge: 2011-04-07 | Disposition: A | Discharge status: Home / Self Care | Payer: No Typology Code available for payment source | Source: Ambulatory Visit | Attending: Thoracic Surgery (Cardiothoracic Vascular Surgery) | Admitting: Thoracic Surgery (Cardiothoracic Vascular Surgery)

## 2011-04-07 ENCOUNTER — Encounter (HOSPITAL_COMMUNITY)
Admission: RE | Admit: 2011-04-07 | Discharge: 2011-04-07 | Disposition: A | Discharge status: Home / Self Care | Payer: No Typology Code available for payment source | Source: Ambulatory Visit | Attending: Thoracic Surgery (Cardiothoracic Vascular Surgery) | Admitting: Thoracic Surgery (Cardiothoracic Vascular Surgery)

## 2011-04-07 ENCOUNTER — Other Ambulatory Visit: Payer: Self-pay | Admitting: Thoracic Surgery (Cardiothoracic Vascular Surgery)

## 2011-04-07 DIAGNOSIS — I059 Rheumatic mitral valve disease, unspecified: Secondary | ICD-10-CM | POA: Insufficient documentation

## 2011-04-07 DIAGNOSIS — I4892 Unspecified atrial flutter: Secondary | ICD-10-CM

## 2011-04-07 DIAGNOSIS — Z01818 Encounter for other preprocedural examination: Secondary | ICD-10-CM | POA: Insufficient documentation

## 2011-04-07 DIAGNOSIS — I10 Essential (primary) hypertension: Secondary | ICD-10-CM | POA: Insufficient documentation

## 2011-04-07 DIAGNOSIS — R0602 Shortness of breath: Secondary | ICD-10-CM | POA: Insufficient documentation

## 2011-04-07 DIAGNOSIS — I340 Nonrheumatic mitral (valve) insufficiency: Secondary | ICD-10-CM

## 2011-04-07 DIAGNOSIS — Z01810 Encounter for preprocedural cardiovascular examination: Secondary | ICD-10-CM | POA: Insufficient documentation

## 2011-04-07 DIAGNOSIS — Z01812 Encounter for preprocedural laboratory examination: Secondary | ICD-10-CM | POA: Insufficient documentation

## 2011-04-07 DIAGNOSIS — I4891 Unspecified atrial fibrillation: Secondary | ICD-10-CM | POA: Insufficient documentation

## 2011-04-07 LAB — CBC
HCT: 40.2 % (ref 39.0–52.0)
MCV: 92.2 fL (ref 78.0–100.0)
RBC: 4.36 MIL/uL (ref 4.22–5.81)
RDW: 13.4 % (ref 11.5–15.5)
WBC: 5.6 10*3/uL (ref 4.0–10.5)

## 2011-04-07 LAB — PROTIME-INR: INR: 1.23 (ref 0.00–1.49)

## 2011-04-07 LAB — URINALYSIS, ROUTINE W REFLEX MICROSCOPIC
Glucose, UA: NEGATIVE mg/dL
Hgb urine dipstick: NEGATIVE
Leukocytes, UA: NEGATIVE
Specific Gravity, Urine: 1.008 (ref 1.005–1.030)
pH: 7 (ref 5.0–8.0)

## 2011-04-07 LAB — COMPREHENSIVE METABOLIC PANEL
Albumin: 4 g/dL (ref 3.5–5.2)
BUN: 15 mg/dL (ref 6–23)
Creatinine, Ser: 0.94 mg/dL (ref 0.50–1.35)
Potassium: 3.5 mEq/L (ref 3.5–5.1)
Total Protein: 6.7 g/dL (ref 6.0–8.3)

## 2011-04-07 LAB — BLOOD GAS, ARTERIAL
Drawn by: 344381
FIO2: 0.21 %
O2 Saturation: 96.9 %
Patient temperature: 98.6
pO2, Arterial: 101 mmHg — ABNORMAL HIGH (ref 80.0–100.0)

## 2011-04-07 LAB — APTT: aPTT: 32 seconds (ref 24–37)

## 2011-04-07 LAB — SURGICAL PCR SCREEN
MRSA, PCR: NEGATIVE
Staphylococcus aureus: NEGATIVE

## 2011-04-07 LAB — PULMONARY FUNCTION TEST

## 2011-04-08 LAB — HEMOGLOBIN A1C: Mean Plasma Glucose: 108 mg/dL (ref <117)

## 2011-04-11 ENCOUNTER — Encounter: Payer: Self-pay | Admitting: Thoracic Surgery (Cardiothoracic Vascular Surgery)

## 2011-04-11 ENCOUNTER — Other Ambulatory Visit: Payer: Self-pay | Admitting: Thoracic Surgery (Cardiothoracic Vascular Surgery)

## 2011-04-11 ENCOUNTER — Inpatient Hospital Stay (HOSPITAL_COMMUNITY)
Admission: RE | Admit: 2011-04-11 | Discharge: 2011-04-17 | DRG: 215 | Disposition: E | Payer: No Typology Code available for payment source | Source: Ambulatory Visit | Attending: Thoracic Surgery (Cardiothoracic Vascular Surgery) | Admitting: Thoracic Surgery (Cardiothoracic Vascular Surgery)

## 2011-04-11 DIAGNOSIS — Y658 Other specified misadventures during surgical and medical care: Secondary | ICD-10-CM | POA: Diagnosis not present

## 2011-04-11 DIAGNOSIS — G4733 Obstructive sleep apnea (adult) (pediatric): Secondary | ICD-10-CM | POA: Diagnosis present

## 2011-04-11 DIAGNOSIS — J95821 Acute postprocedural respiratory failure: Secondary | ICD-10-CM | POA: Diagnosis not present

## 2011-04-11 DIAGNOSIS — E669 Obesity, unspecified: Secondary | ICD-10-CM | POA: Diagnosis present

## 2011-04-11 DIAGNOSIS — F3289 Other specified depressive episodes: Secondary | ICD-10-CM | POA: Diagnosis present

## 2011-04-11 DIAGNOSIS — I10 Essential (primary) hypertension: Secondary | ICD-10-CM | POA: Diagnosis present

## 2011-04-11 DIAGNOSIS — I2789 Other specified pulmonary heart diseases: Secondary | ICD-10-CM | POA: Diagnosis present

## 2011-04-11 DIAGNOSIS — Z7901 Long term (current) use of anticoagulants: Secondary | ICD-10-CM

## 2011-04-11 DIAGNOSIS — I519 Heart disease, unspecified: Secondary | ICD-10-CM | POA: Diagnosis not present

## 2011-04-11 DIAGNOSIS — N179 Acute kidney failure, unspecified: Secondary | ICD-10-CM | POA: Diagnosis not present

## 2011-04-11 DIAGNOSIS — I079 Rheumatic tricuspid valve disease, unspecified: Secondary | ICD-10-CM | POA: Diagnosis present

## 2011-04-11 DIAGNOSIS — I080 Rheumatic disorders of both mitral and aortic valves: Principal | ICD-10-CM | POA: Diagnosis present

## 2011-04-11 DIAGNOSIS — F411 Generalized anxiety disorder: Secondary | ICD-10-CM | POA: Diagnosis present

## 2011-04-11 DIAGNOSIS — M109 Gout, unspecified: Secondary | ICD-10-CM | POA: Diagnosis present

## 2011-04-11 DIAGNOSIS — Z79899 Other long term (current) drug therapy: Secondary | ICD-10-CM

## 2011-04-11 DIAGNOSIS — Y9210 Unspecified residential institution as the place of occurrence of the external cause: Secondary | ICD-10-CM | POA: Diagnosis not present

## 2011-04-11 DIAGNOSIS — I4891 Unspecified atrial fibrillation: Secondary | ICD-10-CM

## 2011-04-11 DIAGNOSIS — I359 Nonrheumatic aortic valve disorder, unspecified: Secondary | ICD-10-CM

## 2011-04-11 DIAGNOSIS — Z823 Family history of stroke: Secondary | ICD-10-CM

## 2011-04-11 DIAGNOSIS — Z7982 Long term (current) use of aspirin: Secondary | ICD-10-CM

## 2011-04-11 DIAGNOSIS — Z87891 Personal history of nicotine dependence: Secondary | ICD-10-CM

## 2011-04-11 DIAGNOSIS — S2500XA Unspecified injury of thoracic aorta, initial encounter: Secondary | ICD-10-CM

## 2011-04-11 DIAGNOSIS — I059 Rheumatic mitral valve disease, unspecified: Secondary | ICD-10-CM

## 2011-04-11 DIAGNOSIS — F329 Major depressive disorder, single episode, unspecified: Secondary | ICD-10-CM | POA: Diagnosis present

## 2011-04-11 DIAGNOSIS — I509 Heart failure, unspecified: Secondary | ICD-10-CM | POA: Diagnosis not present

## 2011-04-11 DIAGNOSIS — E874 Mixed disorder of acid-base balance: Secondary | ICD-10-CM | POA: Diagnosis not present

## 2011-04-11 DIAGNOSIS — D65 Disseminated intravascular coagulation [defibrination syndrome]: Secondary | ICD-10-CM | POA: Diagnosis not present

## 2011-04-11 DIAGNOSIS — E7800 Pure hypercholesterolemia, unspecified: Secondary | ICD-10-CM | POA: Diagnosis present

## 2011-04-11 DIAGNOSIS — J309 Allergic rhinitis, unspecified: Secondary | ICD-10-CM | POA: Diagnosis present

## 2011-04-11 DIAGNOSIS — K219 Gastro-esophageal reflux disease without esophagitis: Secondary | ICD-10-CM | POA: Diagnosis present

## 2011-04-11 LAB — POCT I-STAT 4, (NA,K, GLUC, HGB,HCT)
Glucose, Bld: 108 mg/dL — ABNORMAL HIGH (ref 70–99)
Glucose, Bld: 127 mg/dL — ABNORMAL HIGH (ref 70–99)
Glucose, Bld: 138 mg/dL — ABNORMAL HIGH (ref 70–99)
Glucose, Bld: 132 mg/dL — ABNORMAL HIGH (ref 70–99)
Glucose, Bld: 202 mg/dL — ABNORMAL HIGH (ref 70–99)
Glucose, Bld: 262 mg/dL — ABNORMAL HIGH (ref 70–99)
Glucose, Bld: 153 mg/dL — ABNORMAL HIGH (ref 70–99)
Glucose, Bld: 180 mg/dL — ABNORMAL HIGH (ref 70–99)
Glucose, Bld: 138 mg/dL — ABNORMAL HIGH (ref 70–99)
Glucose, Bld: 172 mg/dL — ABNORMAL HIGH (ref 70–99)
Glucose, Bld: 147 mg/dL — ABNORMAL HIGH (ref 70–99)
HCT: 36 % — ABNORMAL LOW (ref 39.0–52.0)
HCT: 37 % — ABNORMAL LOW (ref 39.0–52.0)
HCT: 30 % — ABNORMAL LOW (ref 39.0–52.0)
HCT: 20 % — ABNORMAL LOW (ref 39.0–52.0)
HCT: 20 % — ABNORMAL LOW (ref 39.0–52.0)
HCT: 16 % — ABNORMAL LOW (ref 39.0–52.0)
HCT: 20 % — ABNORMAL LOW (ref 39.0–52.0)
HCT: 20 % — ABNORMAL LOW (ref 39.0–52.0)
HCT: 26 % — ABNORMAL LOW (ref 39.0–52.0)
Hemoglobin: 12.2 g/dL — ABNORMAL LOW (ref 13.0–17.0)
Hemoglobin: 11.2 g/dL — ABNORMAL LOW (ref 13.0–17.0)
Hemoglobin: 12.6 g/dL — ABNORMAL LOW (ref 13.0–17.0)
Hemoglobin: 10.2 g/dL — ABNORMAL LOW (ref 13.0–17.0)
Hemoglobin: 8.8 g/dL — ABNORMAL LOW (ref 13.0–17.0)
Hemoglobin: 6.8 g/dL — CL (ref 13.0–17.0)
Hemoglobin: 6.8 g/dL — CL (ref 13.0–17.0)
Hemoglobin: 5.4 g/dL — CL (ref 13.0–17.0)
Hemoglobin: 6.8 g/dL — CL (ref 13.0–17.0)
Hemoglobin: 6.8 g/dL — CL (ref 13.0–17.0)
Hemoglobin: 8.8 g/dL — ABNORMAL LOW (ref 13.0–17.0)
Potassium: 3.7 mEq/L (ref 3.5–5.1)
Potassium: 4.1 mEq/L (ref 3.5–5.1)
Potassium: 3.2 mEq/L — ABNORMAL LOW (ref 3.5–5.1)
Potassium: 3.3 mEq/L — ABNORMAL LOW (ref 3.5–5.1)
Potassium: 3.2 mEq/L — ABNORMAL LOW (ref 3.5–5.1)
Potassium: 4.1 mEq/L (ref 3.5–5.1)
Potassium: 4.3 mEq/L (ref 3.5–5.1)
Potassium: 4.4 mEq/L (ref 3.5–5.1)
Potassium: 3.6 mEq/L (ref 3.5–5.1)
Potassium: 3.6 mEq/L (ref 3.5–5.1)
Potassium: 3.8 mEq/L (ref 3.5–5.1)
Sodium: 139 mEq/L (ref 135–145)
Sodium: 139 mEq/L (ref 135–145)
Sodium: 139 mEq/L (ref 135–145)
Sodium: 140 mEq/L (ref 135–145)
Sodium: 139 mEq/L (ref 135–145)
Sodium: 137 mEq/L (ref 135–145)
Sodium: 138 mEq/L (ref 135–145)
Sodium: 142 mEq/L (ref 135–145)
Sodium: 145 mEq/L (ref 135–145)
Sodium: 147 mEq/L — ABNORMAL HIGH (ref 135–145)

## 2011-04-11 LAB — POCT I-STAT 3, ART BLOOD GAS (G3+)
Acid-base deficit: 12 mmol/L — ABNORMAL HIGH (ref 0.0–2.0)
Acid-base deficit: 11 mmol/L — ABNORMAL HIGH (ref 0.0–2.0)
Acid-base deficit: 9 mmol/L — ABNORMAL HIGH (ref 0.0–2.0)
Acid-base deficit: 16 mmol/L — ABNORMAL HIGH (ref 0.0–2.0)
Bicarbonate: 28.8 mEq/L — ABNORMAL HIGH (ref 20.0–24.0)
Bicarbonate: 14.8 mEq/L — ABNORMAL LOW (ref 20.0–24.0)
Bicarbonate: 17.1 mEq/L — ABNORMAL LOW (ref 20.0–24.0)
Bicarbonate: 16.4 mEq/L — ABNORMAL LOW (ref 20.0–24.0)
Bicarbonate: 18.3 mEq/L — ABNORMAL LOW (ref 20.0–24.0)
Bicarbonate: 14.1 mEq/L — ABNORMAL LOW (ref 20.0–24.0)
O2 Saturation: 61 %
O2 Saturation: 83 %
TCO2: 16 mmol/L (ref 0–100)
TCO2: 18 mmol/L (ref 0–100)
TCO2: 20 mmol/L (ref 0–100)
pCO2 arterial: 51.3 mmHg — ABNORMAL HIGH (ref 35.0–45.0)
pCO2 arterial: 45.2 mmHg — ABNORMAL HIGH (ref 35.0–45.0)
pCO2 arterial: 48.8 mmHg — ABNORMAL HIGH (ref 35.0–45.0)
pCO2 arterial: 56.9 mmHg — ABNORMAL HIGH (ref 35.0–45.0)
pH, Arterial: 7.357 (ref 7.350–7.450)
pH, Arterial: 7.373 (ref 7.350–7.450)
pH, Arterial: 7.163 — CL (ref 7.350–7.450)
pH, Arterial: 7.324 — ABNORMAL LOW (ref 7.350–7.450)
pH, Arterial: 7.168 — CL (ref 7.350–7.450)
pH, Arterial: 7.181 — CL (ref 7.350–7.450)
pH, Arterial: 7.001 — CL (ref 7.350–7.450)
pO2, Arterial: 508 mmHg — ABNORMAL HIGH (ref 80.0–100.0)
pO2, Arterial: 60 mmHg — ABNORMAL LOW (ref 80.0–100.0)
pO2, Arterial: 40 mmHg — ABNORMAL LOW (ref 80.0–100.0)
pO2, Arterial: 59 mmHg — ABNORMAL LOW (ref 80.0–100.0)
pO2, Arterial: 64 mmHg — ABNORMAL LOW (ref 80.0–100.0)

## 2011-04-11 LAB — POCT I-STAT 3, VENOUS BLOOD GAS (G3P V)
Acid-Base Excess: 1 mmol/L (ref 0.0–2.0)
Acid-Base Excess: 1 mmol/L (ref 0.0–2.0)
Bicarbonate: 26.4 mEq/L — ABNORMAL HIGH (ref 20.0–24.0)
O2 Saturation: 78 %
TCO2: 28 mmol/L (ref 0–100)
pO2, Ven: 35 mmHg (ref 30.0–45.0)
pO2, Ven: 41 mmHg (ref 30.0–45.0)

## 2011-04-11 LAB — DIC (DISSEMINATED INTRAVASCULAR COAGULATION)PANEL
Fibrinogen: 154 mg/dL — ABNORMAL LOW (ref 204–475)
INR: 2.14 — ABNORMAL HIGH (ref 0.00–1.49)
Prothrombin Time: 24.3 seconds — ABNORMAL HIGH (ref 11.6–15.2)
aPTT: 110 seconds — ABNORMAL HIGH (ref 24–37)

## 2011-04-11 LAB — CBC
Platelets: 242 10*3/uL (ref 150–400)
RBC: 2.84 MIL/uL — ABNORMAL LOW (ref 4.22–5.81)
WBC: 10.8 10*3/uL — ABNORMAL HIGH (ref 4.0–10.5)

## 2011-04-11 LAB — HEMOGLOBIN AND HEMATOCRIT, BLOOD
HCT: 26.9 % — ABNORMAL LOW (ref 39.0–52.0)
Hemoglobin: 8.9 g/dL — ABNORMAL LOW (ref 13.0–17.0)

## 2011-04-12 ENCOUNTER — Inpatient Hospital Stay (HOSPITAL_COMMUNITY): Payer: No Typology Code available for payment source

## 2011-04-12 ENCOUNTER — Encounter: Payer: No Typology Code available for payment source | Admitting: Cardiology

## 2011-04-12 LAB — PREPARE PLATELET PHERESIS
Unit division: 0
Unit division: 0
Unit division: 0
Unit division: 0
Unit division: 0
Unit division: 0
Unit division: 0
Unit division: 0
Unit division: 0

## 2011-04-12 LAB — PREPARE CRYOPRECIPITATE
Unit division: 0
Unit division: 0
Unit division: 0

## 2011-04-12 LAB — CBC
Hemoglobin: 9.8 g/dL — ABNORMAL LOW (ref 13.0–17.0)
MCH: 30.4 pg (ref 26.0–34.0)
MCHC: 34 g/dL (ref 30.0–36.0)
Platelets: 74 10*3/uL — ABNORMAL LOW (ref 150–400)
RDW: 14.7 % (ref 11.5–15.5)

## 2011-04-12 LAB — POCT I-STAT 3, ART BLOOD GAS (G3+)
Acid-base deficit: 20 mmol/L — ABNORMAL HIGH (ref 0.0–2.0)
Bicarbonate: 11.4 mEq/L — ABNORMAL LOW (ref 20.0–24.0)
TCO2: 13 mmol/L (ref 0–100)
pO2, Arterial: 61 mmHg — ABNORMAL LOW (ref 80.0–100.0)

## 2011-04-12 LAB — DIFFERENTIAL
Basophils Absolute: 0 10*3/uL (ref 0.0–0.1)
Eosinophils Absolute: 0 10*3/uL (ref 0.0–0.7)
Lymphs Abs: 0.7 10*3/uL (ref 0.7–4.0)
Monocytes Relative: 9 % (ref 3–12)

## 2011-04-12 LAB — POCT I-STAT 4, (NA,K, GLUC, HGB,HCT)
Glucose, Bld: 113 mg/dL — ABNORMAL HIGH (ref 70–99)
HCT: 24 % — ABNORMAL LOW (ref 39.0–52.0)
Hemoglobin: 8.2 g/dL — ABNORMAL LOW (ref 13.0–17.0)
Sodium: 144 mEq/L (ref 135–145)

## 2011-04-12 LAB — PROTIME-INR
INR: 2.13 — ABNORMAL HIGH (ref 0.00–1.49)
Prothrombin Time: 24.2 seconds — ABNORMAL HIGH (ref 11.6–15.2)

## 2011-04-13 LAB — TYPE AND SCREEN
ABO/RH(D): O NEG
Unit division: 0
Unit division: 0
Unit division: 0
Unit division: 0
Unit division: 0
Unit division: 0
Unit division: 0
Unit division: 0
Unit division: 0
Unit division: 0
Unit division: 0
Unit division: 0
Unit division: 0
Unit division: 0
Unit division: 0
Unit division: 0
Unit division: 0
Unit division: 0
Unit division: 0
Unit division: 0
Unit division: 0
Unit division: 0
Unit division: 0
Unit division: 0
Unit division: 0
Unit division: 0
Unit division: 0
Unit division: 0

## 2011-04-13 LAB — PREPARE FRESH FROZEN PLASMA
Unit division: 0
Unit division: 0
Unit division: 0
Unit division: 0
Unit division: 0
Unit division: 0
Unit division: 0

## 2011-04-13 LAB — POCT I-STAT 7, (LYTES, BLD GAS, ICA,H+H)
Acid-base deficit: 19 mmol/L — ABNORMAL HIGH (ref 0.0–2.0)
Calcium, Ion: <0.25 mmol/L — CL (ref 1.12–1.32)
O2 Saturation: 63 %
Potassium: 5 mEq/L (ref 3.5–5.1)

## 2011-04-13 LAB — POCT I-STAT GLUCOSE: Glucose, Bld: 153 mg/dL — ABNORMAL HIGH (ref 70–99)

## 2011-04-13 LAB — CALCIUM, IONIZED: Calcium, Ion: 0.34 mmol/L — CL (ref 1.12–1.32)

## 2011-04-17 NOTE — Discharge Summary (Signed)
  [name], [name]              ACCOUNT NO.:  [id number]  MEDICAL RECORD NO.:  [id number]  LOCATION:  2303                         FACILITY:  [facility]  PHYSICIAN:  Salvatore Decent. [name], M.D. DATE OF BIRTH:  1945-10-25  DATE OF ADMISSION:  04/29/2011 DATE OF DISCHARGE:  03/27/2011                              DISCHARGE SUMMARY   DEATH SUMMARY  ADMISSION DIAGNOSES: 1. Severe mitral regurgitation. 2. Persistent atrial fibrillation.  PRIMARY DIAGNOSIS/CAUSE OF DEATH:  Severe mitral regurgitation.  ADDITIONAL DIAGNOSES DEVELOPED DURING HOSPITALIZATION: 1. 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 2. Severe aortic insufficiency. 3. Acute respiratory failure. 4. Acute biventricular failure. 5. Coagulopathy with disseminated intravascular coagulation. 6. Hypoxemia. 7. Hypercarbia. 8. Mixed metabolic and respiratory acidosis. 9. Acute oliguric renal failure.  HISTORY OF PRESENT ILLNESS:  The patient is a 65 year-old male with longstanding history of mitral regurgitation and atrial fibrillation. The patient presents with worsening symptoms of exertional shortness of breath.  Both transthoracic and transesophageal echocardiograms confirmed the presence of severe mitral regurgitation with preserved left ventricular function, mild aortic insufficiency, and moderate tricuspid regurgitation.  Left and right heart catheterization revealed normal coronary artery anatomy with no significant coronary artery disease.  There is mild pulmonary hypertension at baseline.  A full history and physical exam has been documented previously.  The patient presents for elective surgical intervention for treatment of mitral regurgitation and atrial fibrillation.  HOSPITAL COURSE:  The patient was admitted to the hospital for elective surgery and taken directly to the operating room on the morning of 2011/04/29.  The patient underwent mitral valve repair and Maze procedure via right  thoracotomy approach.  This procedure was complicated by iatrogenic injury to the aortic root and aortic valve, which required conversion to median sternotomy with repair of the aortic root and replacement of the aortic valve.  After separation from cardiopulmonary bypass, the patient developed acute respiratory failure and biventricular dysfunction, requiring placement of biventricular assist device.  Full details of the operative procedure have been dictated in a separate operative note.  The patient was brought directly to the intensive care unit in critical condition.  During the ensuing several hours, the patient remained severely unstable with severe acute respiratory failure with profound hypoxemia, hypercarbia, and mixed metabolic and respiratory acidosis.  The patient also had profound coagulopathy with diffuse ongoing bleeding.  Ultimately, the patient to succumbed and passed away at 1:30 in the morning on April 12, 2011.  The patient's family was at bedside at the time.     Salvatore Decent. [name], M.D.     CHO/MEDQ  D:  04/15/2011  T:  04/08/2011  Job:  [id number]  Electronically Signed by Tressie Stalker M.D. on 04/14/2011 08:56:52 AM

## 2011-04-17 NOTE — Op Note (Signed)
[name], [name]              ACCOUNT NO.:  [id number]  MEDICAL RECORD NO.:  [id number]  LOCATION:  2303                         FACILITY:  [facility]  PHYSICIAN:  Salvatore Decent. [name], M.D. DATE OF BIRTH:  19-Oct-1945  DATE OF PROCEDURE:  04/07/2011 DATE OF DISCHARGE:                              OPERATIVE REPORT   PREOPERATIVE DIAGNOSES: 1. Severe mitral regurgitation. 2. Persistent atrial fibrillation.  POSTOPERATIVE DIAGNOSES: 1. Severe mitral regurgitation. 2. Persistent atrial fibrillation. 3. 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 4. Severe respiratory and biventricular failure following separation     from bypass.  OPERATIVE PROCEDURE: 1. Mitral valve repair (complex valvuloplasty including closure of     clefts of posterior leaflet and posterior commissure with 28-mm     Sorin MEMO 3-D ring annuloplasty). 2. Cox - Cryomaze procedure (complete biatrial lesion set). 3. Repair of injury to aortic root.4. Aortic valve replacement (23-mm Carbomedics top hat mechanical     prosthesis). 5. Placement of biventricular assist device (Thoratec Centrimag).  SURGEON:  Salvatore Decent. [name], MD  ASSISTANT:  [name], MD  SECOND ASSISTANT:  [name], PA  ANESTHESIA:  [name], MD.  BRIEF CLINICAL NOTE:  The patient is a 65 year old male with longstanding history of mitral regurgitation and atrial fibrillation. He presents with worsening symptoms of exertional shortness of breath have progressed substantially in recent months.  Echocardiogram demonstrates severe mitral regurgitation.  Transesophageal echocardiogram confirms the presence of severe (4+) mitral regurgitation.  There is normal left ventricular systolic function. There is mild aortic insufficiency and moderate tricuspid regurgitation. Left and right heart catheterization demonstrates normal coronary artery anatomy with no significant coronary artery disease.  The patient and his  wife have been counseled regarding the indications, risks, potential benefits of surgery.  Alternative treatment strategies have been discussed.  Alternative surgical approaches have been reviewed in detail and all their questions have been addressed.  The patient provides full informed consent for the operation as described.  OPERATIVE FINDINGS: 1. Combination of both type 1 and type 2 mitral valve dysfunction with     severe mitral regurgitation. 2. Prolapse and billowing involving all three segments of the posterior leaflet, but     no flail segments of leaflets. 3. Considerable pure annular dilatation. 4. Bleeding after completion of mitral valve repair and maze procedure     due to iatrogenic injury to the dome of the left atrium and the     aortic root. 5. Severe aortic insufficiency after initial attempt at separation     from bypass due to iatrogenic injury to the aortic valve. 6. Severe respiratory and right ventricular dysfunction after     separation from cardiopulmonary bypass, ultimately requiring     reinstitution of cardiopulmonary bypass and subsequent placement of     biventricular assist device for mechanical circulatory support. 7. Profound coagulopathy after final separation from cardiopulmonary     bypass. 8. Profound respiratory insufficiency with prolonged hypoxemia after     separation from cardiopulmonary bypass.  OPERATIVE PROCEDURE IN DETAIL:  The patient was brought to the operating room on the above-mentioned date and a time-out procedure was performed verifying correct patient and  correct plan for surgery.  Central monitoring was established by the anesthesia team under the care and direction of Dr. [name].  Specifically, a Swan-Ganz catheter was placed through the left internal jugular approach.  A left brachial arterial line was placed.  Intravenous antibiotics were administered. The patient was placed in the supine position on the operating  table. General endotracheal anesthesia was induced uneventfully.  The patient was initially intubated with a dual-lumen endotracheal tube.  A Foley catheter was placed.  The patient was positioned on the table with a soft roll behind the right scapula and the neck gently extended and turned towards the left.  The patient's right neck, chest, abdomen, both groins, and both lower extremities were prepared and draped in sterile manner.  Baseline transesophageal echocardiogram was performed by Dr. Chaney Malling. Findings were consistent with presence of severe mitral regurgitation within incomplete coaptation all across middle portion of P2 and extending towards both commissures.  There appears to be combination of pure annular dilatation with some prolapse of the posterior leaflet.  There was no evidence for any ruptured segments or chords and there did not appear to be any areas of perforation.  There was no significant restriction.  There was mild aortic insufficiency.  There was moderate tricuspid regurgitation.  A small incision was made in the right inguinal crease and the anterior surface of the right common femoral artery and right common femoral vein were identified.  The common femoral artery was normal in appearance. Single lung ventilation was begun.  Right miniature anterolateral thoracotomy incision was performed.  The incision was placed superior to and lateral to the right nipple.  The pectoralis major muscle was retracted medially and completely preserved.  The right pleural space was entered through the fourth intercostal space.  A soft tissue retractor was placed.  Two 11-mm ports were placed inferiorly through separate stab incisions and the right pleural space was insufflated continuously with carbon dioxide gas through the posterior port.  A pledgeted sutures placed in the dome of the right hemidiaphragm and retracted inferiorly to facilitate exposure.  There was a large  amount of pericardial fat which was removed using electrocautery to facilitate visualization of the pericardial sac.  The right internal jugular vein was cannulated with Seldinger technique and a flexible guidewire was advanced into the right ventricle.  The patient was heparinized systemically.  The right internal jugular vein was cannulated with a 14-French pediatric femoral venous cannula.  The right common femoral vein was cannulated with Seldinger technique and guidewire was passed under transesophageal echocardiogram guidance through the right atrium until the guidewire passes up the superior vena cava.  The femoral vein was dilated with serial dilators and cannulated with a 22-French long femoral venous cannula.  The femoral artery was cannulated with Seldinger technique and a guidewire was advanced under transesophageal echocardiogram guidance until it can be appreciated intraluminally in the descending thoracic aorta.  The femoral artery was dilated with serial dilators and cannulated with a 18-French femoral arterial cannula.  Cardiopulmonary bypass was begun.  Vacuum assist venous drainage was utilized.  Venous drainage was notably excellent.  The pericardium was opened 3 cm anterior to the phrenic nerve.  Upon entry into the pericardial sac, one can appreciate adhesions located surrounding the ascending aorta and medially over the surface of the right ventricle obliterating portions of the pericardial space.  Pericardial sac was free along the right atrium and right ventricular surface.  At this point, a decision was made to  proceed with mitral valve repair and maze procedure under cold fibrillatory arrest rather than with direct aortic cross clamping due to the presence of severe adhesions around the aorta.  The patient was placed in Trendelenburg position.  Systemic cooling was begun.  Ventricular pacing wires placed on the ventricular surface of the right ventricular  free wall.  Ventricular pacing wires were rapid paced until ventricular fibrillation ensues.  Left atriotomy incision was performed posteriorly through the interatrial groove and continue partway across the back wall of left atrium after opening the oblique sinus inferiorly.  A retractor blade was placed into the left atrium and fix to a sidearm placed through a small stab incision just to the right side of the sternum through the third intercostal space.  Exposure of the floor of left atrium and the mitral valve was satisfactory, although somewhat challenging due to the patient's body habitus and depth.  The minithoracotomy incision was extended medially somewhat to facilitate a little better exposure.  The AtriCure Frigitronics Cryothermy system was utilized for completion of the Cox-Cryomaze procedure.  The right atrial lesion set was performed initially including a longitudinal lesion along the lateral aspect of the right atrium extending from the superior vena cava to the inferior vena cava.  A second lesion was then placed obliquely extending from this lesion in anterior and inferior direction to reach the acute margin of the right ventricle.  The left-sided lesions were now performed including a lesion placed across the floor of left atrium from the caudad apex of the atriotomy incision to reach the mitral annulus posteriorly near the junction between P2 and P3.  A mirror image lesion was placed along the epicardial surface of the back wall of left atrium to reach the coronary sinus.  Finally, a box lesion was created to completely isolate the pulmonary veins with one limb placed across the back wall of left atrium from the caudad apex of the atriotomy incision to reach the anterior rim of the left-sided pulmonary veins.  A lesion was then placed across the dome in the left atrium from the cephalad apex of the atriotomy incision to meet the anterior surface of the left- sided  pulmonary veins.  This completes the left side lesion set of the Cox-Cryomaze procedure.  Of note, the box lesion was repeated completely incomplete twice because during the initial application of the Cryothermic system malfunction and there was not an adequate confidence in adequate cooling.  Therefore, the entire box lesion repeated.  Attention now directed to the mitral valve.  The mitral valve is carefully examined.  There was considerable billowing and mild prolapse of P1, P2 and P3.  However, there were no severe areas of prolapse and the majority of the prolapse seems to occur right between the indentations between P1 and P2 and between P2 and P3.  There was no restriction. There was considerable pure annular dilatation.  Interrupted 2-0 Ethibond horizontal mattress sutures were placed circumferentially around the entire mitral annulus.  These sutures will ultimately be utilized for ring annuloplasty, and at this juncture they were utilized to suspend the valve symmetrically.  The valve was again reanalyzed and inspected.  To correct the billowing and prolapse of the posterior leaflet, the indentations between P1 and P2 and between P2 and P3 were both closed using a series of interrupted everting simple CV 5 Gore-Tex sutures.  Additional Magic stitches were placed in the posterior commissure.  At this juncture, the valve was tested with saline and appears  to be competent just with annuloplasty sutures in place.  The valve was sized to accept a 28-mm annuloplasty ring which corresponds fairly closely to the overall dimensions of the anterior leaflet.  A Sorin MEMO 3-D annuloplasty ring (size 28 mm, catalog number [id number], serial number [id number]) was secured and placed uneventfully.  The valve was not tested with saline and appears to be competent.  There was a broad symmetrical line of coaptation and no sign of regurgitation.  A sump drain was placed across the mitral valve to serve  as a left ventricular vent and rewarming was begun.  The left atriotomy incision was closed posteriorly using a two-layer closure of running 3-0 Prolene suture.  The lungs were ventilated and heart allowed to fill.  At this juncture, there appears to be significant bleeding superiorly.  Initially, it seems that the bleeding from the dome of the left atrium, presumably from incomplete closure of the left atriotomy at the cephalad apex.  However, attempts to visualize medial to the superior vena cava were unsuccessful and the bleeding appears to be clearly significant not something that could be tolerated after separation from cardiopulmonary bypass.  Of note, attempts to cardiovert the patient also unsuccessful using the transdermal R2 pads. Therefore, conversion to a full sternotomy was felt necessary in order to satisfactorily correct the bleeding and cardiovert the patient.  Median sternotomy incision was performed.  The pericardium was opened anteriorly.  The heart was cardioverted using epicardial paddles and returned to a junctional rhythm.  AV pacing was employed.  At this point, the source of bleeding was further explored.  The bleeding seems to be along the dome the left atrium down near the junction with the aortic root along the noncoronary sinus of Valsalva.  Attempts to place a pledgeted sutures in this region resulted in further tearing and increased bleeding with serious concern for development of injury to the aortic root.  A cardioplegic cannula was placed directly in the ascending aorta and systemic cooling was resumed.  The aortic crossclamp was applied and cold blood cardioplegia was delivered in antegrade fashion through the aortic root.  Iced saline slush was applied for topical hypothermia. After cardioplegic arrest had been completed, one can clearly identify a small hole in the noncoronary sinus of Valsalva and the dome of the left atrium.  This was repaired  using a series of interrupted 4-0 pledgeted Prolene sutures.  The lungs were ventilated and heart allowed to fill and the aortic crossclamp was removed after crossclamp time of 35 minutes.  The heart began to beat spontaneously without need for cardioversion. The lungs were ventilated and heart allowed to fill and the patient was begun on intravenous milrinone and dopamine infusions.  The patient was weaned from cardiopulmonary bypass on dopamine at 10 mcg/kg per minute and milrinone at 0.375 mcg/kg per minute.  Transesophageal echocardiogram was performed after separation from bypass demonstrates moderate-severe aortic insufficiency.  The mitral valve appears intact.  Left ventricular function appears reasonably normal.  However, there appears to be significant aortic insufficiency.  There was at least moderate in severity and the patient developed hypotension with elevated pulmonary artery pressures.  Presumably an injury to the aortic valve was occurred and clearly must be corrected.  A retrograde cardioplegic cannula was placed through the right atrium into the coronary sinus.  Antegrade cardioplegic cannula was replaced back into the aortic root.  The aorta was crossclamped and cold blood cardioplegia was delivered initially in antegrade fashion and subsequently  switched to retrograde.  Repeat doses of cardioplegia were administered intermittently throughout the second crossclamp portion of the operation every 20 minutes through the retrograde coronary sinus catheter to maintain completely flat electrocardiogram.  Iced saline slush was applied for topical hypothermia.  An oblique transverse aortotomy incision was performed.  The aortic valve was inspected.  There was evidence for through-and-through suture near the nadir of the noncoronary leaflet of the aortic valve from ring annuloplasty suture.  The anterior leaflet of the mitral valve appears normal and the ring appears to be  in satisfactory position.  However, the sutures from the anterior portion of the mitral annuloplasty ring had distorted the aortic annulus and substantially damaged the leaflet. Under the circumstances an attempt to repair the valve itself seems  unwise due to the patient's extremely prolonged operation and need for definitive repair.  Aortic valve replacement is felt to be mandatory.  The leaflets of the aortic valve were excised sharply.  After the leaflets had been removed, the anterior leaflet of the mitral valve and annuloplasty ring were again inspected and appear to be intact.  The aortic root was sized to accept a 23-mm mechanical prosthesis.  Aortic valve replacement is performed using interrupted 2-0 Ethibond horizontal mattress sutures placed circumferentially around the entire annulus. Pledgets in the subannular position.  The aortic valve was replaced using a Carbomedics top hat mechanical prosthesis (size 23 mm, catalog number S5-023, serial number [id number] - H).  The valve seats normally and without difficulty.  Rewarming was begun.  The aortotomy was closed using a two-layer closure of running 4-0 Prolene suture with Teflon felt strips to buttress the suture line.  The lungs were ventilated and heart allowed to fill.  A Megu needle was placed in the aortic root to serve as a vent.  The aortic crossclamp was removed after crossclamp time of 71 minutes.  The heart began to beat spontaneously without need for cardioversion. The lungs were ventilated and heart allowed to fill.  The patient was rewarmed for more than 45 minutes.  The patient was subsequently weaned and separated from cardiopulmonary bypass.  The patient weaned from bypass on Norepinephrine, dopamine, and milrinone infusions.  AV pacing was employed.  Followup transesophageal echocardiogram performed by Dr. Chaney Malling after separation from bypass demonstrates the aortic valve prosthesis appears to be functioning  normally.  The mitral valve appears to be functioning well with mild residual mitral regurgitation.  The left ventricle was not well-visualized and of note, the patient's heart was partially displaced out of the chest due to severe generalized edema and severely elevated airway pressures.  The right ventricle cannot be visualized nor can the degree of tricuspid regurgitation.  The patient initially remained reasonably stable for approximately 25 minutes after separation from bypass.  However, it became increasingly difficult to maintain oxygen saturations  and the patient developed severe hypoxemia as well as hypotension that ultimately became refractory.  Right ventricular function began to deteriorate. Cardiopulmonary bypass was subsequently reinstituted.  The only potential need for salvage was felt to be placement of biventricular assist device for temporary support due to severe right ventricular failure with severe respiratory insufficiency and moderate left ventricular dysfunction.  Thoratec Centrimag system was utilized for biventricular assist.  For left ventricular assist, the femoral arterial cannula was used for outflow and a 32-French straight venous cannula was placed directly to the apex of the left ventricle to serve as an inflow.  For right ventricular assist, the femoral venous cannula was  utilized for drainage and a 7-French arterial cannula was placed directly in the pulmonary artery for outflow.  After the pulmonary artery in the left ventricular apex had been cannulated and the lines prepared, the patient was weaned and separated from cardiopulmonary bypass.  Biventricular assist support was rapidly instituted and flow volumes were gradually increased.  There was severe coagulopathy.  Protamine was administered to reverse anticoagulation.  An exhaustive search for sites and mechanical bleeding was performed.  The patient was transfused massive blood  products including packed red blood cells, fresh frozen plasma, cryoprecipitate, and platelets to treat profound coagulopathy with thrombocytopenia and DIC.  The patient remains in the operating room for several hours. Coagulopathy gradually improves, but remained substantial, ultimately requiring administration of recombinant factor VII in addition to all the blood products administered.  The right thoracotomy incision was closed in layers.  The right groin incision was gently reapproximated around the arterial and venous cannula.  A sternotomy incision was closed with a temporary closure of the Esmarch.  The patient was kept in the operating room for an additional hour and half and during this time, chest tube output initially seems to be excessive.  Esmarch dressing was taken down and site for mechanical bleeding were again search for.  Additional pledgeted suture were placed around the left ventricular apical cannula due to some bleeding this area, although there also appears to remain severe diffuse coagulopathy with bleeding from all cut surfaces.  Coagulopathy gradually seems to improve.  The patient continues to have difficulty with oxygenation, but ventilatory support seems to improve slightly.  The patient's Esmarch dressing and three closed and covered with a wound V.A.C.  The patient was transported to the Surgical Intensive Care Unit in critical condition.  During the operation, the patient root received massive amounts of blood products including more than 30 units of packed red blood cells, 8 packs of platelets, 16 units fresh frozen plasma, and 210 packs of cryoprecipitate.  The patient's wife and family were immediately updated after completion of the procedure regarding the poor prognosis and all operative findings.  All their questions have been addressed.     Salvatore Decent. [name], M.D.     CHO/MEDQ  D:  03/31/2011  T:  03/29/2011  Job:  [id number]  cc:   Marca Ancona, MD [name], MD  Electronically Signed by Tressie Stalker M.D. on 04/04/2011 10:28:25 AM

## 2011-04-17 DEATH — deceased

## 2011-04-24 ENCOUNTER — Other Ambulatory Visit: Payer: No Typology Code available for payment source

## 2011-05-01 ENCOUNTER — Encounter: Payer: No Typology Code available for payment source | Admitting: Internal Medicine

## 2011-06-14 DIAGNOSIS — Z0271 Encounter for disability determination: Secondary | ICD-10-CM

## 2011-07-25 ENCOUNTER — Ambulatory Visit: Payer: Self-pay | Admitting: Internal Medicine

## 2011-07-28 ENCOUNTER — Ambulatory Visit: Payer: Self-pay | Admitting: Pharmacist

## 2011-07-28 DIAGNOSIS — I4891 Unspecified atrial fibrillation: Secondary | ICD-10-CM

## 2012-06-30 IMAGING — CR DG CHEST 2V
2 series · 2 of 2 positions shown · non-contrast
Comparison: 07/26/2010

CLINICAL DATA: Preoperative assessment for mitral valve repair,
shortness of breath, tingling in chest, hypertension

CHEST - 2 VIEW

[view not recorded (1 of 2)]
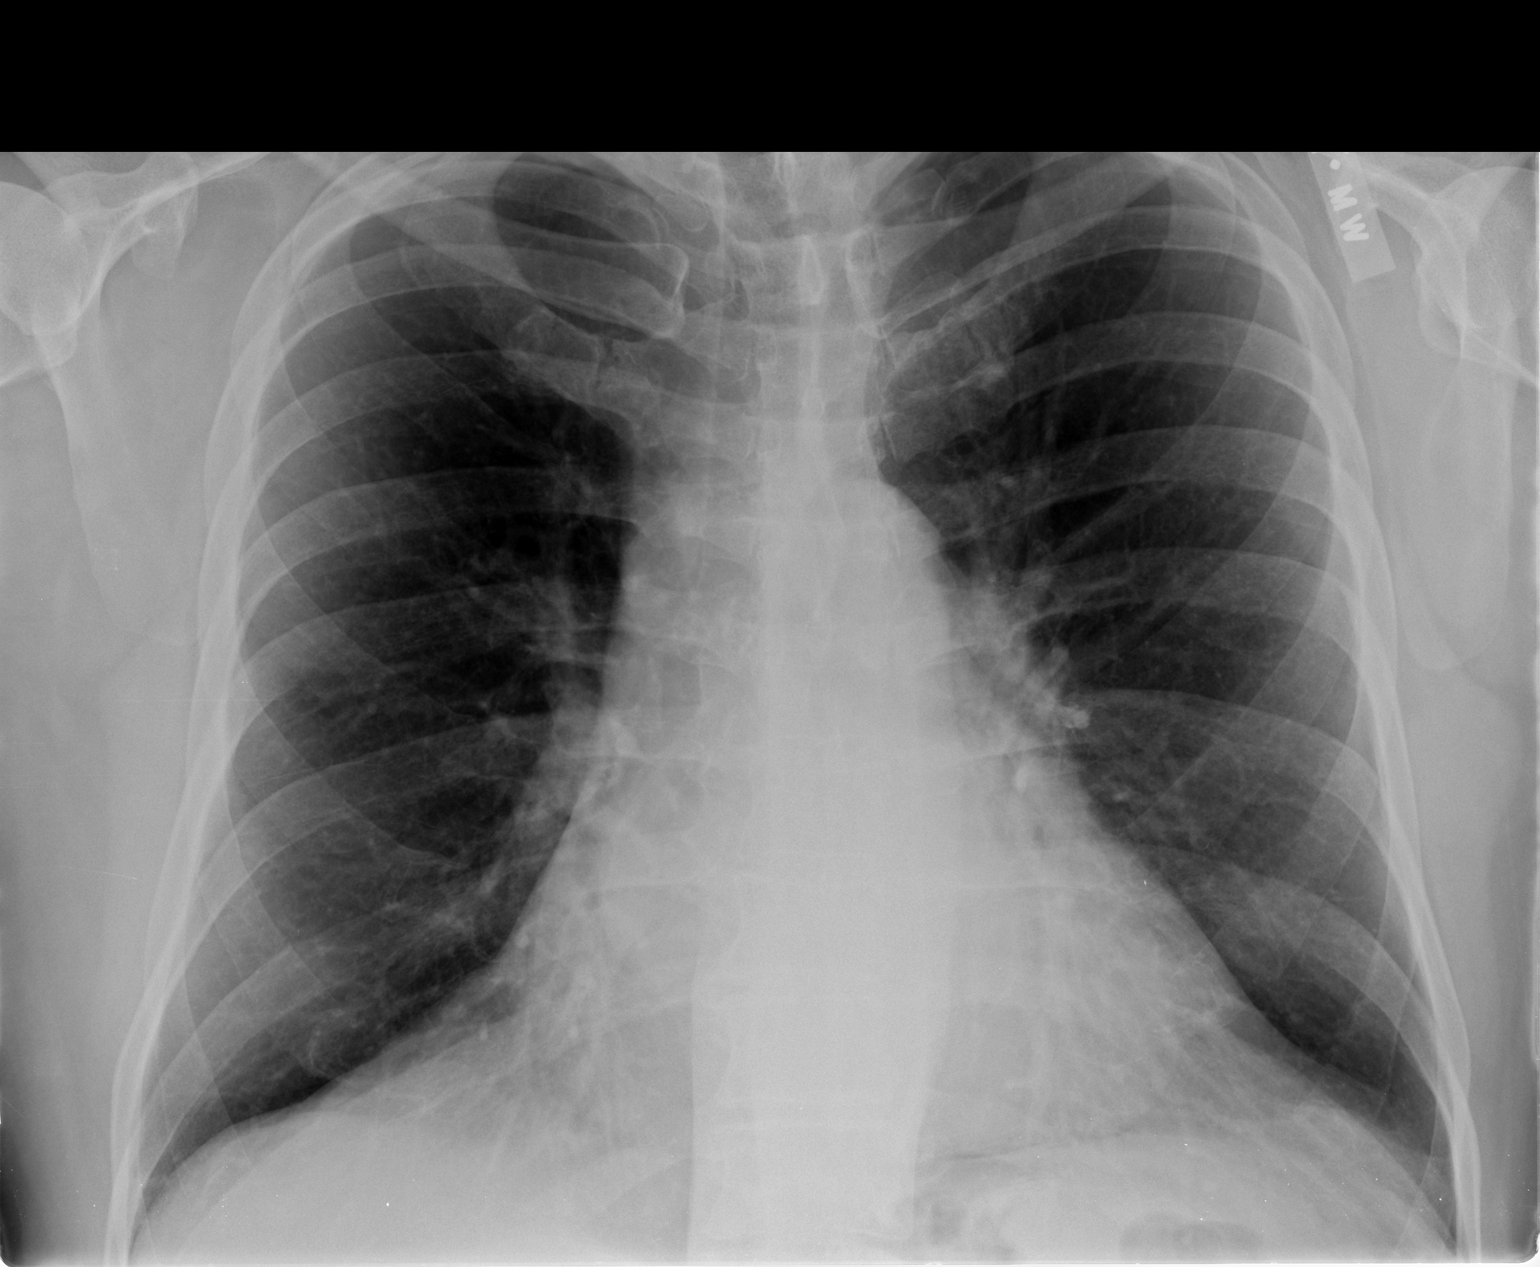

[view not recorded (2 of 2)]
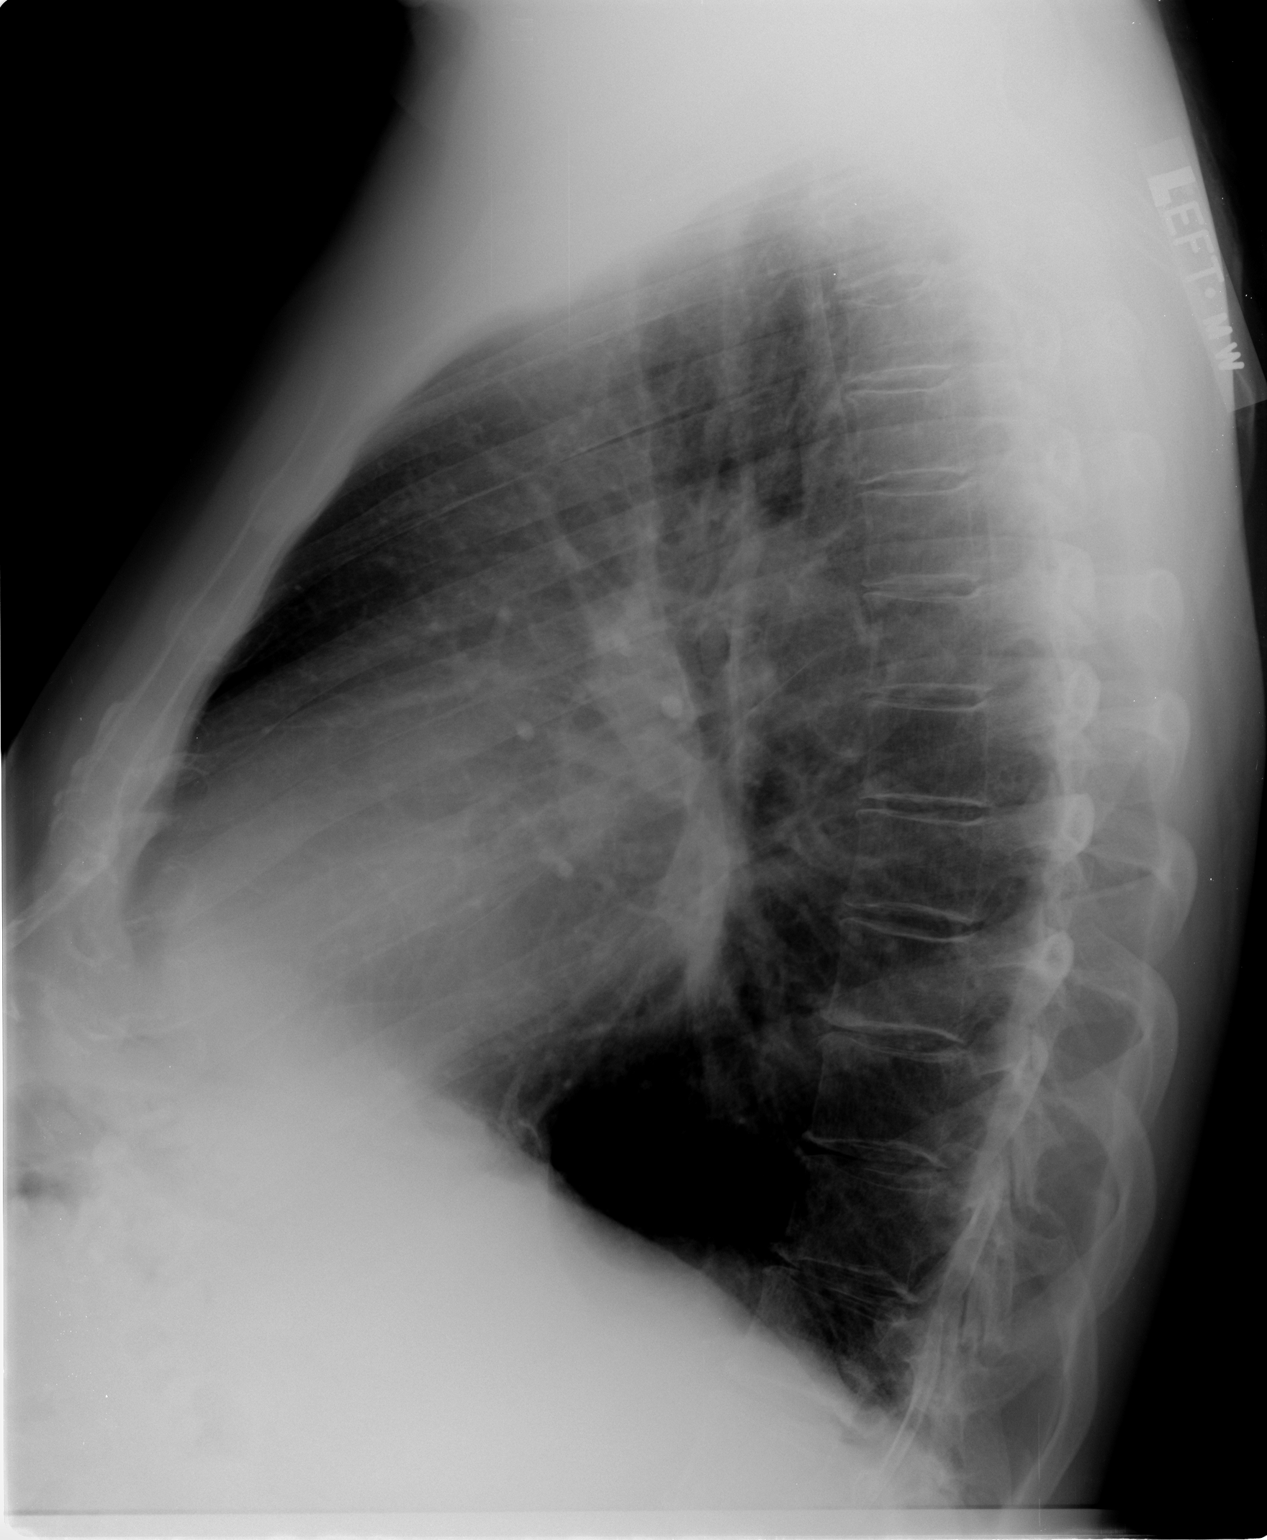

[2 of 2 positions shown; findings below may reference images not displayed]

FINDINGS: Enlargement of cardiac silhouette.
Minimal pulmonary vascular congestion.
Mediastinal contours normal.
Lungs clear.
No pleural effusion or pneumothorax.
No acute osseous findings.
IMPRESSION: No acute abnormalities.
Enlargement of cardiac silhouette.
